# Patient Record
Sex: Female | Born: 1950 | Race: White | Hispanic: No | Marital: Married | Smoking: Former smoker
Health system: Southern US, Community
[De-identification: ages and names within clinical notes are randomized; demographics above are authoritative.]

## PROBLEM LIST (undated history)

## (undated) DIAGNOSIS — L709 Acne, unspecified: Secondary | ICD-10-CM

## (undated) DIAGNOSIS — E039 Hypothyroidism, unspecified: Secondary | ICD-10-CM

## (undated) DIAGNOSIS — D649 Anemia, unspecified: Secondary | ICD-10-CM

## (undated) DIAGNOSIS — I639 Cerebral infarction, unspecified: Secondary | ICD-10-CM

## (undated) DIAGNOSIS — M8580 Other specified disorders of bone density and structure, unspecified site: Secondary | ICD-10-CM

## (undated) HISTORY — PX: BREAST ENHANCEMENT SURGERY: SHX7

## (undated) HISTORY — DX: Acne, unspecified: L70.9

## (undated) HISTORY — DX: Hypothyroidism, unspecified: E03.9

## (undated) HISTORY — DX: Cerebral infarction, unspecified: I63.9

## (undated) HISTORY — PX: VEIN LIGATION AND STRIPPING: SHX2653

## (undated) HISTORY — DX: Anemia, unspecified: D64.9

## (undated) HISTORY — DX: Other specified disorders of bone density and structure, unspecified site: M85.80

---

## 1998-02-20 ENCOUNTER — Other Ambulatory Visit: Admission: RE | Admit: 1998-02-20 | Discharge: 1998-02-20 | Payer: Self-pay | Admitting: Gynecology

## 1998-04-23 ENCOUNTER — Other Ambulatory Visit: Admission: RE | Admit: 1998-04-23 | Discharge: 1998-04-23 | Payer: Self-pay | Admitting: Gynecology

## 1999-03-25 ENCOUNTER — Other Ambulatory Visit: Admission: RE | Admit: 1999-03-25 | Discharge: 1999-03-25 | Payer: Self-pay | Admitting: Gynecology

## 2000-04-12 ENCOUNTER — Other Ambulatory Visit: Admission: RE | Admit: 2000-04-12 | Discharge: 2000-04-12 | Payer: Self-pay | Admitting: Gynecology

## 2001-01-26 ENCOUNTER — Encounter: Payer: Self-pay | Admitting: Vascular Surgery

## 2001-01-28 ENCOUNTER — Encounter (INDEPENDENT_AMBULATORY_CARE_PROVIDER_SITE_OTHER): Payer: Self-pay | Admitting: Specialist

## 2001-01-28 ENCOUNTER — Ambulatory Visit (HOSPITAL_COMMUNITY): Admission: RE | Admit: 2001-01-28 | Discharge: 2001-01-28 | Payer: Self-pay | Admitting: Vascular Surgery

## 2001-04-05 ENCOUNTER — Other Ambulatory Visit: Admission: RE | Admit: 2001-04-05 | Discharge: 2001-04-05 | Payer: Self-pay | Admitting: Gynecology

## 2002-06-20 ENCOUNTER — Other Ambulatory Visit: Admission: RE | Admit: 2002-06-20 | Discharge: 2002-06-20 | Payer: Self-pay | Admitting: Gynecology

## 2003-01-20 DIAGNOSIS — I639 Cerebral infarction, unspecified: Secondary | ICD-10-CM

## 2003-01-20 HISTORY — PX: PATENT FORAMEN OVALE CLOSURE: SHX2181

## 2003-01-20 HISTORY — DX: Cerebral infarction, unspecified: I63.9

## 2003-12-10 ENCOUNTER — Other Ambulatory Visit: Admission: RE | Admit: 2003-12-10 | Discharge: 2003-12-10 | Payer: Self-pay | Admitting: Gynecology

## 2003-12-19 ENCOUNTER — Ambulatory Visit: Payer: Self-pay | Admitting: Gastroenterology

## 2003-12-21 ENCOUNTER — Ambulatory Visit: Payer: Self-pay | Admitting: Gastroenterology

## 2004-09-09 ENCOUNTER — Ambulatory Visit (HOSPITAL_COMMUNITY): Admission: RE | Admit: 2004-09-09 | Discharge: 2004-09-09 | Payer: Self-pay | Admitting: Plastic Surgery

## 2004-10-16 ENCOUNTER — Inpatient Hospital Stay (HOSPITAL_COMMUNITY): Admission: EM | Admit: 2004-10-16 | Discharge: 2004-10-18 | Payer: Self-pay | Admitting: Emergency Medicine

## 2004-10-17 ENCOUNTER — Ambulatory Visit: Payer: Self-pay | Admitting: Cardiology

## 2004-10-17 ENCOUNTER — Encounter: Payer: Self-pay | Admitting: Cardiology

## 2004-10-20 ENCOUNTER — Ambulatory Visit (HOSPITAL_COMMUNITY): Admission: RE | Admit: 2004-10-20 | Discharge: 2004-10-20 | Payer: Self-pay | Admitting: Cardiology

## 2004-10-20 ENCOUNTER — Encounter: Payer: Self-pay | Admitting: Cardiology

## 2004-10-23 ENCOUNTER — Ambulatory Visit (HOSPITAL_COMMUNITY): Admission: RE | Admit: 2004-10-23 | Discharge: 2004-10-23 | Payer: Self-pay | Admitting: Neurology

## 2004-12-05 ENCOUNTER — Ambulatory Visit (HOSPITAL_COMMUNITY): Admission: RE | Admit: 2004-12-05 | Discharge: 2004-12-06 | Payer: Self-pay | Admitting: Cardiology

## 2005-01-15 ENCOUNTER — Other Ambulatory Visit: Admission: RE | Admit: 2005-01-15 | Discharge: 2005-01-15 | Payer: Self-pay | Admitting: Gynecology

## 2006-01-21 ENCOUNTER — Other Ambulatory Visit: Admission: RE | Admit: 2006-01-21 | Discharge: 2006-01-21 | Payer: Self-pay | Admitting: Gynecology

## 2007-05-05 ENCOUNTER — Other Ambulatory Visit: Admission: RE | Admit: 2007-05-05 | Discharge: 2007-05-05 | Payer: Self-pay | Admitting: Gynecology

## 2010-06-06 NOTE — H&P (Signed)
[name], MANALANG              ACCOUNT NO.:  [id number]   MEDICAL RECORD NO.:  [id number]          PATIENT TYPE:  INP   LOCATION:  3041                         FACILITY:  [facility]   PHYSICIAN:  Casimiro Needle L. Reynolds, M.D.DATE OF BIRTH:  Jan 12, 1951   DATE OF ADMISSION:  10/16/2004  DATE OF DISCHARGE:                                HISTORY & PHYSICAL   CHIEF COMPLAINT:  Diplopia dysarthria, left-sided facial numbness.   HISTORY OF PRESENT ILLNESS:  This is the initial stroke service admission  for this 60 year old woman with little past medical history.  The patient  was at home eating dinner with her husband tonight, when at about 7:30 p.m.  noticed the acute onset of a dizzy sensation; which did not feel like  vertigo.  She also noted that the right side of her face felt numb and  tight.  She held her hand up in front of her face at that point and noted  diplopia when looking out front to her left and to her right.  Her husband  noted that she had some slurred speech at that time, and the patient felt  like she was having difficulty getting words out.  The patient's husband put  her in the car and drove her to the emergency room.  He said that en route  she seemed a little bit lethargic.   Since she has arrived, most of her symptoms have gotten much better;  although the left-sided facial symptoms persist.  She denies any history of  previous similar symptoms. She had no associated headache, chest pain,  palpitations; or any numbness, tingling or weakness of the limbs.   PAST MEDICAL HISTORY:  1.  Hypothyroidism, for which she is on medications.  2.  Recent face lift in July 2006, and since that time has had some      persistent asymmetry of the face, especially of eye closure.  Otherwise, she denies chronic medical problems.  She has no history of  surgeries.   FAMILY HISTORY:  Relevant for stroke in her mother, who had her problems in  69s.   SOCIAL HISTORY:  She does not  smoke.  She exercises regularly.  She lives  with her husband and is normally independent in activities of daily living.   ALLERGIES:  NO KNOWN DRUG ALLERGIES.   MEDICATIONS:  1.  Levothyroxine.  2.  Hormone replacement therapy.   REVIEW OF SYSTEMS:  Review of systems is negative, except as outlined in the  HPI in the emergency room and admission nursing records.   PHYSICAL EXAMINATION:  VITAL SIGNS:  Temperature 97.6, blood pressure  122/81, pulse 69, respirations 20.  GENERAL:  This is a healthy-appearing woman, lying supine in the hospital  bed and no evident distress.  HEENT:  Head normocephalic and atraumatic.  Oropharynx benign.  NECK:  Supple without carotid bruits.  HEART:  Regular rate and rhythm without murmur.  CHEST:  Clear to auscultation bilaterally.  NEUROLOGIC:  Mental status:  She is awake, alert and fully oriented.  Recent  and remote memory are intact.  Attention span, concentration and  fund of  knowledge are all appropriate. She has no defects to competitional naming  and can repeat a phrase.  Mood is euthymic and affect appropriate.   Cranial nerves: Pupils equal and reactive.  Extraocular movements are full  without nystagmus.  Visual fields are full to confrontation.  Hearing is  intact to finger snap.  Facial sensation is intact to pinprick.  She seems  to have a little bit of a droop on the left side of her face, but it is not  clear if this acute.  The patient's tongue and palate move normally and  symmetrically.  Shoulder shrug and strength is normal.   Motor Testing:  Normal bulk and tone.  Normal strength in all tested  extremity muscles.   Sensation: intact to pinprick and light touch in all extremities.   Coordination:  Cerebellar finger-to-nose and heel-to-shin are performed  well.   Gait:  She arises easily from the bed.  She is able to walk around in the ER  a little bit without difficulty.  She can actually tandem walk a little bit  without  difficulty.   LABORATORY REVIEW:  CBC:  White count 7.3, hemoglobin 13.7, platelets  385,000.  CMET is normal.  Prothrombin Time 13.1, PTT slightly elevated at  39.  CT of the head is personally reviewed and studied; to my eye looks  normal.   IMPRESSION:  Suspected cerebrovascular event involving the brainstem.  Etiology is uncertain and she is relatively young; has no known chronic risk  factors.   PLAN:  Will treat with aspirin a day.  Will admit for routine stroke workup,  including:  MRI, MRA, carotid transcranial Doppler, 2-D echocardiogram,  telemetry monitoring and stroke labs.  Stroke Service is to follow.      Michael L. Thad Ranger, M.D.  Electronically Signed     MLR/MEDQ  D:  10/16/2004  T:  10/17/2004  Job:  [id number]

## 2010-06-06 NOTE — Consult Note (Signed)
[name], [name]              ACCOUNT NO.:  [id number]   MEDICAL RECORD NO.:  [id number]          PATIENT TYPE:  OIB   LOCATION:  6523                         FACILITY:  [facility]   PHYSICIAN:  Cristy Hilts. Jacinto Halim, MD       DATE OF BIRTH:  Aug 09, 1950   DATE OF CONSULTATION:  DATE OF DISCHARGE:  12/06/2004                                   CONSULTATION   ADMISSION DIAGNOSES:  1.  Embolic stroke.  2.  Patent foramen ovale.   DISCHARGE DIAGNOSES:  1.  Embolic stroke.  2.  Patent foramen ovale.   PROCEDURE:  Intracardiac echo, double contrast study, patent foramen ovale  closure with 33-mm carotid seal septal occluder and post contrast study to  rule out right to left shunt.   BRIEF HISTORY:  The patient is a 60 year old female with no significant  prior cardiac history except for hypothyroidism. She has been on thyroid  replacement for some time. She was recently admitted to [facility] on  October 16, 2004, with sudden onset of slurred speech, double vision,  facial numbness. She was eventually found to have a left thalamic infarct.  Her hypercoagulability workup was negative. She underwent a 2-D echo and a  TEE evaluation which revealed a large PFO with a strong positive bubble  study for right to left shunting. Given no other significant cardiovascular  risk for her stroke in the present of PFO. She was evaluated by Dr. Jacinto Halim  for PFO closure. After a long discussion it was his opinion that they go  forward with closure of the PFO and she was admitted for the same at this  time.   PAST MEDICAL HISTORY:  As above.   MEDICATIONS ON ADMISSION:  1.  Synthroid 75 mcg daily.  2.  Baby aspirin 81 mg p.o. daily.   ALLERGIES:  One known.   FAMILY HISTORY:  No history of premature coronary artery disease. No history  of premature CV disease.   PAST SURGICAL HISTORY:  Facelift and breast augmentation.   SOCIAL HISTORY:  She does not smoke and does not use alcohol. She exercises  on a regular basis. For further history and physical please see Dr. Verl Dicker  note.   HOSPITAL COURSE:  The patient was admitted and underwent the above noted  procedure. She tolerated the procedure well. The following a.m. the cath  site looked good. She was seen by Dr. Avie Echevaria post procedure and it was  his opinion she was doing well. He following a.m. the patient was seen by  Dr. Jenne Campus. Labs showed a hemoglobin of 12.8, hematocrit 36.6, white count  7.9, platelet count 267,000. Electrolytes were normal. BUN was 11,  creatinine 0.9, and glucose was 94. At that point the patient was felt to be  ready for discharge. She was discharged home on her Synthroid 75 mcg daily,  aspirin 325 mg daily, Toprol XL 25 mg daily, and Plavix 75 mg one daily. She  was given prescriptions for the Toprol and Plavix. Also instructed on her  instruction sheet to have antibiotic prophylaxis for dental work or surgical  procedures over the next six months. Will follow up in Dr. Verl Dicker office in  two to three weeks.      Eber Hong, P.A.      Cristy Hilts. Jacinto Halim, MD  Electronically Signed    WDJ/MEDQ  D:  12/06/2004  T:  12/07/2004  Job:  [id number]   cc:   Genene Churn. Love, M.D.  Fax: [phone]

## 2010-06-06 NOTE — Discharge Summary (Signed)
[name], [name]              ACCOUNT NO.:  [id number]   MEDICAL RECORD NO.:  [id number]          PATIENT TYPE:  INP   LOCATION:  3041                         FACILITY:  [facility]   PHYSICIAN:  Melvyn Novas, M.D.  DATE OF BIRTH:  [birth year]   DATE OF ADMISSION:  10/16/2004  DATE OF DISCHARGE:  10/18/2004                           DISCHARGE SUMMARY - REFERRING   HISTORY OF PRESENT ILLNESS:  [name] is a patient who presented  with left sided facial numbness and diplopia as well as slurring of speech.  The admission initially warranted a CT scan of the head, which showed no  abnormality in the emergency room. The patient symptoms resolved by  themselves without any treatment to a great deal and she had no history of  previous similar symptoms. She has also not complained of associated  headache, chest pain, palpitations, tingling, weakness, or numbness.   PAST MEDICAL HISTORY:  Only positive for hypothyroidism. She recently had a  face lift in July of 2006 and breast augmentation. Otherwise, she has no  chronic medical problems. Has no history of other surgeries besides the two  cosmetic surgeries.   FAMILY HISTORY:  Relevant for a stroke in her mother.   MEDICATIONS:  She was on Levothyroxine and hormone replacement therapy.   PHYSICAL EXAMINATION:  GENERAL:  Examination showed no focal abnormality.   LABORATORY DATA:  CBC with diff was normal. Complex metabolic panel was  normal. The patient had a creatinine level of 0.9, PTT of 39. Hemoglobin and  hematocrit 13 and 38. Blood serum glucose of 83. Homocystine was negative.  Lipid panel was normal. Oxygen saturation on room air was normal.   The patient had a 2-D echocardiogram, which failed to document any cardiac  source of embolism or abnormality on October 17, 2004.   The patient had a cerebrovascular evaluation by Doppler study, which showed  no significant internal carotid artery stenosis on either side and  normal  vertebral artery flow. There is no evidence of any plaque noted on her MRA  or her Doppler studies. MRI documented, however, a small approximately 1 cm  sized left deep thalamic ischemic lesion.   ASSESSMENT:  1.  Lacunar stroke.  2.  Risk factors possible hormone replacement therapy but there is truly no      evidence of any other physiologic abnormality in this patient.   PLAN:  We will ask her to start a baby aspirin a day, continue her thyroid  medicine, and discontinue the hormone replacement therapy.   The patient asked for a copy of her discharge summary to be forwarded to her  primary care physician, Dr. Morton Stall in [place], [place] [facility].           ______________________________  Melvyn Novas, M.D.     CD/MEDQ  D:  10/18/2004  T:  10/18/2004  Job:  [id number]   cc:   Morton Stall, M.D.  [facility]

## 2010-06-06 NOTE — Op Note (Signed)
[place]  Patient:    [name], NUSSBAUMER Visit Number: [id number] MRN: [id number]          Service Type: Attending:  Larina Earthly, M.D. Dictated by:   Larina Earthly, M.D. Proc. Date: 01/28/01   CC:         Dr. Morton Stall, 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  PROCEDURES: 1. Ligation and stripping of greater saphenous vein from groin to mid thigh. 2. Tributary varicosity removal with Trivex System.  SURGEON:  Larina Earthly, M.D.  ASSISTANT:  Nurse.  ANESTHESIA:  General endotracheal.  COMPLICATIONS:  None.  DISPOSITION:  To recovery room, stable.  PROCEDURE IN DETAIL:  The patient was taken to the operating room after the varicosities in her left leg were marked.  She had an incision over her saphenofemoral junction and carried down to isolate the saphenofemoral junction, which was doubly ligated with 2-0 silk ties.  A stripper was passed retrograde down the saphenofemoral junction to the level of the distal thigh. Valves became competent at this level and this was also seen on Duplex preoperatively,  A separate incision was made at this level and the stripper and the saphenous vein was ligated distal to this and the stripper head was brought out through this incision.  Next, using a Trivex System and tumescent solution with epinephrine and lidocaine, the large tributary varicosity extending from the level of her groin across her anterior thigh to her lateral knee and down into her calf was removed through multiple separate small stab incisions.  The transilluminated veins were removed with the cutter system. The tumescent solution was reused to irrigate these subcutaneous tunnels with irrigant.  The patient also had removal of tributary  varicosities in the medial and posterior calf.  After all tributary varicosities were removed, the wounds again were flushed with tumescent solution and there was tumescent solution instilled along the tract of the saphenous vein.  The saphenous vein was then removed from the distal thigh to the groin with the stripper. Hemostasis was obtained with pressure.  The groin incision and the distal thigh incision were closed with a 3-0 Vicryl in the subcutaneous and subcuticular tissue.  Benzoin and Steri-Strips were placed over all stab incisions from the Trivex removal of the tributary varicosities.  Kerlix and Coban were used for a compression dressing.  The patient was transferred to the recovery room in stable condition. Dictated by:   Larina Earthly, M.D. Attending:  Larina Earthly, M.D. DD:  01/28/01 TD:  01/28/01 Job: [id number]

## 2010-06-06 NOTE — Op Note (Signed)
[name], [name]              ACCOUNT NO.:  [id number]   MEDICAL RECORD NO.:  [id number]          PATIENT TYPE:  OIB   LOCATION:  6523                         FACILITY:  [facility]   PHYSICIAN:  Cristy Hilts. Jacinto Halim, MD       DATE OF BIRTH:  1950/11/05   DATE OF PROCEDURE:  12/05/2004  DATE OF DISCHARGE:  12/06/2004                                 OPERATIVE REPORT   REFERRING PHYSICIANS:  1.  Harl Bowie, MD  2.  Delia Heady, MD   PROCEDURE PERFORMED:  1.  Intracardiac echocardiogram.  2.  Double-contrast injection to evaluate the patent foramen ovale.  3.  Closure of the patent foramen ovale with a 33-mm CardioSEAL septal      occluder.  4.  Post-procedure double-contrast study.   ATTENDING PHYSICIAN:  Cristy Hilts. Jacinto Halim, MD   INDICATION:  [name] is a 60 year old female with no significant  cardiovascular risk factors, who only has history of hypothyroidism and is  on Synthroid.  She has had a thalamic stroke.  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  There was spontaneous shunting from  right-to-left by TEE.  The stroke occurred in September of 2006.  Given  this, it was felt that the PFO was probably the most probable culprit.  After explaining the risks, benefits, alternatives and data regarding PFO  closure, the patient consented to have PFO closure.  She was brought to the  catheterization lab for reevaluate of her PFO and for possible PFO closure.   INTRACARDIAC ECHOCARDIOGRAPHIC DATA:  Intracardiac echocardiogram revealed  normal tricuspid valve with minimal tricuspid regurgitation and normal right  ventricle.  The mitral valve was normal with minimal mitral valve  regurgitation with normal left ventricular systolic function.  The aortic  valve was normal; there was no evidence of aortic regurgitation.  Right  atrium and left atrium appeared to be normal.  The interatrial septum was  aneurysmal with a large PFO measuring 0.9 cm x ICE and  with a long tunnel.  Otherwise, no structural abnormalities were noted.   ASSESSMENT:  Successful patent foramen ovale closure with a 33-mm CardioSEAL  septal occluder.  Post closure, agitated saline injection revealed no  evidence of right-to-left shunting.   RECOMMENDATIONS:  1.  The patient underwent successful closure of the PFO with a 33-mm      CardioSEAL septal occluder.  She will be continued on aspirin and Plavix      for a period of 3 months and then aspirin indefinitely.  2.  She needs antibiotic prophylaxis for a period of 6 months.  3.  I have started her on prophylactic Toprol-XL for probable SVT and this      will be done for a period of 6 weeks only, and then will be stopped.  4.  If the patient remains stable, she will be discharged home in the      morning.   TECHNIQUE OF PROCEDURE:  Under the usual sterile precautions, using an 25-  French right femoral venous access and a 9-French left femoral  venous  access, an intracardiac echo probe was introduced through the left femoral  venous access.  The cardiac structures were carefully analyzed and  visualized.   Using a 6-French multipurpose A2 catheter, the PFO was easily crossed.  Using a 0.025-inch Amplatzer wire with a soft tip, the Amplatzer wire was  carefully positioned in the left upper pulmonary vein.  Then using a 25-mm  PFO sizing balloon, the size of the PFO and also the length of the tunnel  were carefully measured.  It was decided that probably a 33-mm occluder  device was most appropriate.  Then after withdrawing the sizing balloon, the  11-French sheath was exchanged to a 11-French Mullins sheath.  The sheath  was carefully positioned across the interatrial septum into the atrium.  Then the septal occluder was carefully advanced through the [facility] sheath  and the left atrial side was carefully deployed.  After confirming the  device was positioned through the intracardiac echo, the right atrial side   was then deployed.  Excellent apposition was noted.  Then the device was  released.  A double-contrast injection was again performed post procedure.  Excellent results were noted.  There were no immediate complications noted.  The patient tolerated the procedure well.  During the procedure, intravenous  heparin was administered and the ACT was maintained at greater than 250.      Cristy Hilts. Jacinto Halim, MD  Electronically Signed     JRG/MEDQ  D:  12/05/2004  T:  12/06/2004  Job:  [id number]   cc:   Pramod P. Pearlean Brownie, MD  Fax: [phone]

## 2011-10-06 ENCOUNTER — Other Ambulatory Visit: Payer: Self-pay | Admitting: Gynecology

## 2011-10-06 DIAGNOSIS — R928 Other abnormal and inconclusive findings on diagnostic imaging of breast: Secondary | ICD-10-CM

## 2011-10-13 ENCOUNTER — Ambulatory Visit
Admission: RE | Admit: 2011-10-13 | Discharge: 2011-10-13 | Disposition: A | Discharge status: Home / Self Care | Payer: 59 | Source: Ambulatory Visit | Attending: Gynecology | Admitting: Gynecology

## 2011-10-13 DIAGNOSIS — R928 Other abnormal and inconclusive findings on diagnostic imaging of breast: Secondary | ICD-10-CM

## 2011-12-01 ENCOUNTER — Encounter: Payer: Self-pay | Admitting: Sports Medicine

## 2011-12-01 ENCOUNTER — Ambulatory Visit (INDEPENDENT_AMBULATORY_CARE_PROVIDER_SITE_OTHER): Payer: 59 | Admitting: Sports Medicine

## 2011-12-01 VITALS — BP 125/79 | HR 60 | Ht 60.0 in | Wt 105.5 lb

## 2011-12-01 DIAGNOSIS — M4120 Other idiopathic scoliosis, site unspecified: Secondary | ICD-10-CM

## 2011-12-01 DIAGNOSIS — M419 Scoliosis, unspecified: Secondary | ICD-10-CM | POA: Insufficient documentation

## 2011-12-01 DIAGNOSIS — M545 Low back pain: Secondary | ICD-10-CM | POA: Insufficient documentation

## 2011-12-01 NOTE — Progress Notes (Signed)
Patient ID: [name], female   DOB: 1950/03/11, 61 y.o.   MRN: [id number] Subjective:   61 yo new patient presents for evaluation of low back pain x 2 years. Pain is located at the midline and toward the L side. She describes the pain as stiffness and achiness. It is non-radiating. Symptoms occur daily each morning and after long periods of standing. She denies associated fever, chills, weight loss, tingling/numbness. She did have an 8 mos course of frequent diarrhea associated with increased stress but this was not associated with the onset of her low back pain and has resolved. She does have a history of osteopenia diagnosed on a bone density scan, does not recall T score.   Medical History:  Hypothyroidism on synthroid Stroke in 2005 secondary to PFO Osteopenia  Hormone replacement therapy Activella   Negative history of inflammatory bowel disease or pelvic inflammatory disease.   Surgical History PFO repair  Family History  Hrt disease- mother, deceased age 23 HTN-mother DM-neg   Social History  Previous smoker quit 2 years ago.  2 drinks daily on the weekend only Works as an Airline pilot for [employer]   Objective:  BP 125/79  Pulse 60  Ht 5' (1.524 m)  Wt 105 lb 8 oz (47.854 kg)  BMI 20.60 kg/m2 General appearance: alert, cooperative, no distress and thin, appears younger than stated age.  MSK: Back: L shoulder higher than right.  Lumbar scoliosis with curve and rotation rightward.  Elevation of RT low back with forward bend Normal leg lengths.  SI joint with decreased movement on L.  No spinous process tenderness. Left L4 paraspinal muscle soreness.  Negative straight leg raising test. Negative FABER.  Able to heel and toe walk without difficulty.  2+ patellar and Achilles reflex on R. 1+ patellar and Achilles reflex on L.   Gait:  Negative trendelenburg. R hip rotates anteriorly.  Neutral gait

## 2011-12-01 NOTE — Assessment & Plan Note (Addendum)
A: low back pain without radiation. Lumbar scoliosis with rotation noted. Pain concerning for degenerativ disc disease. P:  AP and lateral lumbar and sacral spine films. Will call with results.  Avoid exercises that load the disc (weight bearing squats and burpies).  Perform exercises to stretch the low back including knee to chest, knee to shoulder, b/l knee to chest with rocking, sit ups and superman.  F/u in 4-6 weeks.

## 2011-12-01 NOTE — Assessment & Plan Note (Signed)
A: R lumbar scoliosis, ? Acquired. P:  Evaluate with plain films.

## 2011-12-07 ENCOUNTER — Ambulatory Visit
Admission: RE | Admit: 2011-12-07 | Discharge: 2011-12-07 | Disposition: A | Discharge status: Home / Self Care | Payer: 59 | Source: Ambulatory Visit | Attending: Sports Medicine | Admitting: Sports Medicine

## 2011-12-07 DIAGNOSIS — M419 Scoliosis, unspecified: Secondary | ICD-10-CM

## 2011-12-07 DIAGNOSIS — M545 Low back pain: Secondary | ICD-10-CM

## 2011-12-07 NOTE — Addendum Note (Signed)
Addended by: Garen Grams F on: 12/07/2011 03:33 PM   Modules accepted: Orders

## 2012-01-05 ENCOUNTER — Ambulatory Visit: Payer: 59 | Admitting: Sports Medicine

## 2012-01-06 ENCOUNTER — Other Ambulatory Visit: Payer: Self-pay | Admitting: Neurological Surgery

## 2012-01-06 DIAGNOSIS — M4310 Spondylolisthesis, site unspecified: Secondary | ICD-10-CM

## 2012-01-08 ENCOUNTER — Other Ambulatory Visit: Payer: 59

## 2014-01-10 ENCOUNTER — Other Ambulatory Visit: Payer: Self-pay | Admitting: Gynecology

## 2014-01-16 LAB — CYTOLOGY - PAP

## 2014-05-05 ENCOUNTER — Emergency Department (HOSPITAL_COMMUNITY): Payer: Managed Care, Other (non HMO)

## 2014-05-05 ENCOUNTER — Encounter (HOSPITAL_COMMUNITY): Payer: Self-pay

## 2014-05-05 ENCOUNTER — Emergency Department (HOSPITAL_COMMUNITY)
Admission: EM | Admit: 2014-05-05 | Discharge: 2014-05-05 | Disposition: A | Discharge status: Home / Self Care | Payer: Managed Care, Other (non HMO) | Attending: Emergency Medicine | Admitting: Emergency Medicine

## 2014-05-05 DIAGNOSIS — Y9389 Activity, other specified: Secondary | ICD-10-CM | POA: Insufficient documentation

## 2014-05-05 DIAGNOSIS — Y9289 Other specified places as the place of occurrence of the external cause: Secondary | ICD-10-CM | POA: Diagnosis not present

## 2014-05-05 DIAGNOSIS — Z79899 Other long term (current) drug therapy: Secondary | ICD-10-CM | POA: Diagnosis not present

## 2014-05-05 DIAGNOSIS — Z87891 Personal history of nicotine dependence: Secondary | ICD-10-CM | POA: Insufficient documentation

## 2014-05-05 DIAGNOSIS — S52531A Colles' fracture of right radius, initial encounter for closed fracture: Secondary | ICD-10-CM | POA: Insufficient documentation

## 2014-05-05 DIAGNOSIS — Y998 Other external cause status: Secondary | ICD-10-CM | POA: Insufficient documentation

## 2014-05-05 DIAGNOSIS — S52501A Unspecified fracture of the lower end of right radius, initial encounter for closed fracture: Secondary | ICD-10-CM

## 2014-05-05 DIAGNOSIS — W1839XA Other fall on same level, initial encounter: Secondary | ICD-10-CM | POA: Insufficient documentation

## 2014-05-05 DIAGNOSIS — Z8673 Personal history of transient ischemic attack (TIA), and cerebral infarction without residual deficits: Secondary | ICD-10-CM | POA: Diagnosis not present

## 2014-05-05 DIAGNOSIS — Z8639 Personal history of other endocrine, nutritional and metabolic disease: Secondary | ICD-10-CM | POA: Diagnosis not present

## 2014-05-05 DIAGNOSIS — S6991XA Unspecified injury of right wrist, hand and finger(s), initial encounter: Secondary | ICD-10-CM | POA: Diagnosis present

## 2014-05-05 DIAGNOSIS — W19XXXA Unspecified fall, initial encounter: Secondary | ICD-10-CM

## 2014-05-05 MED ORDER — HYDROMORPHONE HCL 1 MG/ML IJ SOLN
0.5000 mg | Freq: Once | INTRAMUSCULAR | Status: AC
  Administered 2014-05-05: 0.5 mg via INTRAVENOUS
  Filled 2014-05-05: qty 1

## 2014-05-05 MED ORDER — SODIUM CHLORIDE 0.9 % IV BOLUS (SEPSIS)
500.0000 mL | Freq: Once | INTRAVENOUS | Status: AC
  Administered 2014-05-05: 500 mL via INTRAVENOUS

## 2014-05-05 MED ORDER — ONDANSETRON HCL 4 MG/2ML IJ SOLN
4.0000 mg | Freq: Once | INTRAMUSCULAR | Status: AC
  Administered 2014-05-05: 4 mg via INTRAVENOUS
  Filled 2014-05-05: qty 2

## 2014-05-05 MED ORDER — OXYCODONE-ACETAMINOPHEN 5-325 MG PO TABS: 1.0000 | ORAL_TABLET | Freq: Four times a day (QID) | ORAL | Status: DC | PRN

## 2014-05-05 NOTE — Progress Notes (Signed)
Orthopedic Tech Progress Note Patient Details:  [name] 1950/01/25 [id number]  Ortho Devices Type of Ortho Device: Ace wrap, Arm sling, Sugartong splint Ortho Device/Splint Location: RUE Ortho Device/Splint Interventions: Ordered, Application   Braulio Bosch 05/05/2014, 6:53 PM

## 2014-05-05 NOTE — ED Notes (Signed)
Pt also c/o left lower back pain.

## 2014-05-05 NOTE — ED Notes (Signed)
Onset 30 minutes ago pt fell backwards on right hand. Knot noted to wrist area.  Pt nauseous.

## 2014-05-05 NOTE — ED Notes (Signed)
Ortho notified

## 2014-05-05 NOTE — Discharge Instructions (Signed)
Present to the Emergency Department at 6 am Sunday morning (tomorrow) for repair of your wrist.  Do not have anything to eat or drink after midnight.     Cast or Splint Care Casts and splints support injured limbs and keep bones from moving while they heal. It is important to care for your cast or splint at home.  HOME CARE INSTRUCTIONS  Keep the cast or splint uncovered during the drying period. It can take 24 to 48 hours to dry if it is made of plaster. A fiberglass cast will dry in less than 1 hour.  Do not rest the cast on anything harder than a pillow for the first 24 hours.  Do not put weight on your injured limb or apply pressure to the cast until your health care provider gives you permission.  Keep the cast or splint dry. Wet casts or splints can lose their shape and may not support the limb as well. A wet cast that has lost its shape can also create harmful pressure on your skin when it dries. Also, wet skin can become infected.  Cover the cast or splint with a plastic bag when bathing or when out in the rain or snow. If the cast is on the trunk of the body, take sponge baths until the cast is removed.  If your cast does become wet, dry it with a towel or a blow dryer on the cool setting only.  Keep your cast or splint clean. Soiled casts may be wiped with a moistened cloth.  Do not place any hard or soft foreign objects under your cast or splint, such as cotton, toilet paper, lotion, or powder.  Do not try to scratch the skin under the cast with any object. The object could get stuck inside the cast. Also, scratching could lead to an infection. If itching is a problem, use a blow dryer on a cool setting to relieve discomfort.  Do not trim or cut your cast or remove padding from inside of it.  Exercise all joints next to the injury that are not immobilized by the cast or splint. For example, if you have a long leg cast, exercise the hip joint and toes. If you have an arm cast or  splint, exercise the shoulder, elbow, thumb, and fingers.  Elevate your injured arm or leg on 1 or 2 pillows for the first 1 to 3 days to decrease swelling and pain.It is best if you can comfortably elevate your cast so it is higher than your heart. SEEK MEDICAL CARE IF:   Your cast or splint cracks.  Your cast or splint is too tight or too loose.  You have unbearable itching inside the cast.  Your cast becomes wet or develops a soft spot or area.  You have a bad smell coming from inside your cast.  You get an object stuck under your cast.  Your skin around the cast becomes red or raw.  You have new pain or worsening pain after the cast has been applied. SEEK IMMEDIATE MEDICAL CARE IF:   You have fluid leaking through the cast.  You are unable to move your fingers or toes.  You have discolored (blue or white), cool, painful, or very swollen fingers or toes beyond the cast.  You have tingling or numbness around the injured area.  You have severe pain or pressure under the cast.  You have any difficulty with your breathing or have shortness of breath.  You have chest  pain. Document Released: 01/03/2000 Document Revised: 10/26/2012 Document Reviewed: 07/14/2012 [facility] Patient Information 2015 [place], [place]. This information is not intended to replace advice given to you by your health care provider. Make sure you discuss any questions you have with your health care provider.   Wrist Fracture A wrist fracture is a break or crack in one of the bones of your wrist. Your wrist is made up of eight small bones at the palm of your hand (carpal bones) and two long bones that make up your forearm (radius and ulna).  CAUSES   A direct blow to the wrist.  Falling on an outstretched hand.  Trauma, such as a car accident or a fall. RISK FACTORS Risk factors for wrist fracture include:   Participating in contact and high-risk sports, such as skiing, biking, and ice  skating.  Taking steroid medicines.  Smoking.  Being female.  Being Caucasian.  Drinking more than three alcoholic beverages per day.  Having low or lowered bone density (osteoporosis or osteopenia).  Age. Older adults have decreased bone density.  Women who have had menopause.  History of previous fractures. SIGNS AND SYMPTOMS Symptoms of wrist fractures include tenderness, bruising, and inflammation. Additionally, the wrist may hang in an odd position or appear deformed.  DIAGNOSIS Diagnosis may include:  Physical exam.  X-ray. TREATMENT Treatment depends on many factors, including the nature and location of the fracture, your age, and your activity level. Treatment for wrist fracture can be nonsurgical or surgical.  Nonsurgical Treatment A plaster cast or splint may be applied to your wrist if the bone is in a good position. If the fracture is not in good position, it may be necessary for your health care provider to realign it before applying a splint or cast. Usually, a cast or splint will be worn for several weeks.  Surgical Treatment Sometimes the position of the bone is so far out of place that surgery is required to apply a device to hold it together as it heals. Depending on the fracture, there are a number of options for holding the bone in place while it heals, such as a cast and metal pins.  HOME CARE INSTRUCTIONS  Keep your injured wrist elevated and move your fingers as much as possible.  Do not put pressure on any part of your cast or splint. It may break.   Use a plastic bag to protect your cast or splint from water while bathing or showering. Do not lower your cast or splint into water.  Take medicines only as directed by your health care provider.  Keep your cast or splint clean and dry. If it becomes wet, damaged, or suddenly feels too tight, contact your health care provider right away.  Do not use any tobacco products including cigarettes, chewing  tobacco, or electronic cigarettes. Tobacco can delay bone healing. If you need help quitting, ask your health care provider.  Keep all follow-up visits as directed by your health care provider. This is important.  Ask your health care provider if you should take supplements of calcium and vitamins C and D to promote bone healing. SEEK MEDICAL CARE IF:   Your cast or splint is damaged, breaks, or gets wet.  You have a fever.  You have chills.  You have continued severe pain or more swelling than you did before the cast was put on. SEEK IMMEDIATE MEDICAL CARE IF:   Your hand or fingernails on the injured arm turn blue or gray, or  feel cold or numb.  You have decreased feeling in the fingers of your injured arm. MAKE SURE YOU:  Understand these instructions.  Will watch your condition.  Will get help right away if you are not doing well or get worse. Document Released: 10/15/2004 Document Revised: 05/22/2013 Document Reviewed: 01/23/2011 [facility] Patient Information 2015 [place], [place]. This information is not intended to replace advice given to you by your health care provider. Make sure you discuss any questions you have with your health care provider.

## 2014-05-05 NOTE — ED Notes (Signed)
Dr Reese at the bedside.

## 2014-05-05 NOTE — ED Provider Notes (Signed)
CSN: [id number]     Arrival date & time 05/05/14  1615 History   First MD Initiated Contact with Patient 05/05/14 1632     Chief Complaint  Patient presents with  . Hand Injury     Patient is a 64 y.o. female presenting with hand injury. The history is provided by the patient. No language interpreter was used.  Hand Injury  [name] presents for evaluation of right wrist pain following a fall. She had a mechanical fall when she was moving a mattress today. She fell backwards with her outstretched right hand. She felt a snap. She does have some associated low back pain, but has a history of low back pain. She denies any head injury or loss of consciousness. She is right-handed and works as an Optometrist. She has significant pain in her right wrist. She has no history of broken bones. She has been evaluated for shoulder problems in the past.  Past Medical History  Diagnosis Date  . Hypothyroidism     on synthroid   . Stroke, embolic 9937    from PFO   Past Surgical History  Procedure Laterality Date  . Patent foramen ovale closure  2005   Family History  Problem Relation Age of Onset  . Heart disease Mother   . Hypertension Mother   . Diabetes Neg Hx    History  Substance Use Topics  . Smoking status: Former Smoker    Types: Cigarettes    Quit date: 11/30/2009  . Smokeless tobacco: Never Used  . Alcohol Use: 2.0 oz/week    4 drink(s) per week     Comment: 2 drinks nightly on weekend    OB History    No data available     Review of Systems  All other systems reviewed and are negative.     Allergies  Review of patient's allergies indicates no known allergies.  Home Medications   Prior to Admission medications   Medication Sig Start Date End Date Taking? Authorizing Provider  estradiol-norethindrone (ACTIVELLA) 1-0.5 MG per tablet Take 1 tablet by mouth daily.    Historical Provider, MD  SYNTHROID 75 MCG tablet  10/26/11   Historical Provider, MD   BP 95/51 mmHg   Pulse 71  Temp(Src) 97.3 F (36.3 C) (Oral)  Resp 18  Ht 5' (1.524 m)  Wt 103 lb (46.72 kg)  BMI 20.12 kg/m2  SpO2 100% Physical Exam  Constitutional: She is oriented to person, place, and time. She appears well-developed and well-nourished.  Uncomfortable appearing  HENT:  Head: Normocephalic and atraumatic.  Cardiovascular: Normal rate and regular rhythm.   No murmur heard. Pulmonary/Chest: Effort normal and breath sounds normal. No respiratory distress.  Abdominal: Soft. There is no tenderness. There is no rebound and no guarding.  Musculoskeletal:  Swelling and deformity to the right wrist with local tenderness to palpation. 2+ radial pulses. Moves all digits distally. No abrasions. No elbow or shoulder tenderness. No C, T, L-spine tenderness.  Neurological: She is alert and oriented to person, place, and time.  Sensation to light touch intact throughout all digits.  Skin: Skin is warm and dry.  Psychiatric: She has a normal mood and affect. Her behavior is normal.  Nursing note and vitals reviewed.   ED Course  Procedures (including critical care time) SPLINT APPLICATION Date/Time: 1:69 PM Authorized by: Quintella Reichert Consent: Verbal consent obtained. Risks and benefits: risks, benefits and alternatives were discussed Consent given by: patient Splint applied by: orthopedic technician Location  details: RUE Splint type: sugar tong Post-procedure: The splinted body part was neurovascularly unchanged following the procedure. Patient tolerance: Patient tolerated the procedure well with no immediate complications.    Labs Review Labs Reviewed - No data to display  Imaging Review Dg Lumbar Spine Complete  05/05/2014   CLINICAL DATA:  [place] [facility] off bed today.  Injured right wrist and back.  EXAM: RIGHT WRIST - COMPLETE 3+ VIEW; LUMBAR SPINE - COMPLETE 4+ VIEW  COMPARISON:  Lumbar spine MRI 01/11/2012  FINDINGS: Right wrist:  There is a comminuted dorsally impacted  intra-articular fracture of the distal radius. No definite ulnar styloid fracture. The carpal bones are intact.  Lumbar spine:  Mild right convex lumbar scoliosis and moderate degenerative lumbar spondylosis with advanced disc disease and facet disease at L4-5. No acute fracture is identified.  IMPRESSION: Dorsally impacted distal radius fracture (collies fracture).  Advanced degenerative disc disease at L4-5 but no acute findings in the lumbar spine.   Electronically Signed   By: Marijo Sanes M.D.   On: 05/05/2014 18:08   Dg Wrist Complete Right  05/05/2014   CLINICAL DATA:  [place] off bed today.  Injured right wrist and back.  EXAM: RIGHT WRIST - COMPLETE 3+ VIEW; LUMBAR SPINE - COMPLETE 4+ VIEW  COMPARISON:  Lumbar spine MRI 01/11/2012  FINDINGS: Right wrist:  There is a comminuted dorsally impacted intra-articular fracture of the distal radius. No definite ulnar styloid fracture. The carpal bones are intact.  Lumbar spine:  Mild right convex lumbar scoliosis and moderate degenerative lumbar spondylosis with advanced disc disease and facet disease at L4-5. No acute fracture is identified.  IMPRESSION: Dorsally impacted distal radius fracture (collies fracture).  Advanced degenerative disc disease at L4-5 but no acute findings in the lumbar spine.   Electronically Signed   By: Marijo Sanes M.D.   On: 05/05/2014 18:08     EKG Interpretation   Date/Time:  Saturday May 05 2014 19:07:20 EDT Ventricular Rate:  63 PR Interval:  144 QRS Duration: 64 QT Interval:  434 QTC Calculation: 444 R Axis:   88 Text Interpretation:   Sinus rhythm Atrial premature complexes Borderline  right axis deviation Lead(s) I were not used for morphology analysis  Confirmed by Hazle Coca [id number]) on 05/05/2014 7:12:17 PM      MDM   Final diagnoses:  Fall, initial encounter  Distal radius fracture, right, closed, initial encounter    Patient here for evaluation for injuries following a mechanical fall. Has a distal  radius fracture. Discussed with Dr. Caralyn Guile with [facility]. The patient was placed in a sugar tong splint with plans to return tomorrow for outpatient surgical repair. There is no evidence of additional injuries. Discussed with patient home care.   Quintella Reichert, MD 05/05/14 [phone]

## 2014-05-06 ENCOUNTER — Ambulatory Visit (HOSPITAL_COMMUNITY): Payer: Managed Care, Other (non HMO) | Admitting: Anesthesiology

## 2014-05-06 ENCOUNTER — Ambulatory Visit (HOSPITAL_COMMUNITY)
Admission: RE | Admit: 2014-05-06 | Discharge: 2014-05-06 | Disposition: A | Discharge status: Home / Self Care | Payer: Managed Care, Other (non HMO) | Source: Ambulatory Visit | Attending: Orthopedic Surgery | Admitting: Orthopedic Surgery

## 2014-05-06 ENCOUNTER — Encounter (HOSPITAL_COMMUNITY): Payer: Self-pay | Admitting: Anesthesiology

## 2014-05-06 ENCOUNTER — Encounter (HOSPITAL_COMMUNITY)
Admission: RE | Disposition: A | Discharge status: Home / Self Care | Payer: Self-pay | Source: Ambulatory Visit | Attending: Orthopedic Surgery

## 2014-05-06 DIAGNOSIS — Z87891 Personal history of nicotine dependence: Secondary | ICD-10-CM | POA: Insufficient documentation

## 2014-05-06 DIAGNOSIS — Z8673 Personal history of transient ischemic attack (TIA), and cerebral infarction without residual deficits: Secondary | ICD-10-CM | POA: Diagnosis not present

## 2014-05-06 DIAGNOSIS — W19XXXA Unspecified fall, initial encounter: Secondary | ICD-10-CM | POA: Insufficient documentation

## 2014-05-06 DIAGNOSIS — I739 Peripheral vascular disease, unspecified: Secondary | ICD-10-CM | POA: Diagnosis not present

## 2014-05-06 DIAGNOSIS — S52571A Other intraarticular fracture of lower end of right radius, initial encounter for closed fracture: Secondary | ICD-10-CM | POA: Diagnosis present

## 2014-05-06 DIAGNOSIS — E039 Hypothyroidism, unspecified: Secondary | ICD-10-CM | POA: Diagnosis not present

## 2014-05-06 HISTORY — PX: OPEN REDUCTION INTERNAL FIXATION (ORIF) DISTAL RADIAL FRACTURE: SHX5989

## 2014-05-06 SURGERY — OPEN REDUCTION INTERNAL FIXATION (ORIF) DISTAL RADIUS FRACTURE
Anesthesia: General | Laterality: Left

## 2014-05-06 MED ORDER — PROPOFOL 10 MG/ML IV BOLUS
INTRAVENOUS | Status: DC | PRN
  Administered 2014-05-06: 200 mg via INTRAVENOUS

## 2014-05-06 MED ORDER — 0.9 % SODIUM CHLORIDE (POUR BTL) OPTIME
TOPICAL | Status: DC | PRN
  Administered 2014-05-06: 1000 mL

## 2014-05-06 MED ORDER — MIDAZOLAM HCL 2 MG/2ML IJ SOLN
INTRAMUSCULAR | Status: AC
  Filled 2014-05-06: qty 2

## 2014-05-06 MED ORDER — PHENYLEPHRINE 40 MCG/ML (10ML) SYRINGE FOR IV PUSH (FOR BLOOD PRESSURE SUPPORT)
PREFILLED_SYRINGE | INTRAVENOUS | Status: AC
  Filled 2014-05-06: qty 10

## 2014-05-06 MED ORDER — LIDOCAINE HCL (CARDIAC) 20 MG/ML IV SOLN
INTRAVENOUS | Status: AC
  Filled 2014-05-06: qty 5

## 2014-05-06 MED ORDER — MIDAZOLAM HCL 5 MG/5ML IJ SOLN
INTRAMUSCULAR | Status: DC | PRN
  Administered 2014-05-06: 2 mg via INTRAVENOUS

## 2014-05-06 MED ORDER — ONDANSETRON HCL 4 MG/2ML IJ SOLN
INTRAMUSCULAR | Status: AC
  Filled 2014-05-06: qty 2

## 2014-05-06 MED ORDER — PHENYLEPHRINE HCL 10 MG/ML IJ SOLN
INTRAMUSCULAR | Status: DC | PRN
  Administered 2014-05-06 (×3): 80 ug via INTRAVENOUS

## 2014-05-06 MED ORDER — HYDROMORPHONE HCL 1 MG/ML IJ SOLN: 0.2500 mg | INTRAMUSCULAR | Status: DC | PRN

## 2014-05-06 MED ORDER — LACTATED RINGERS IV SOLN
INTRAVENOUS | Status: DC | PRN
  Administered 2014-05-06 (×2): via INTRAVENOUS

## 2014-05-06 MED ORDER — OXYCODONE HCL 5 MG PO TABS: 5.0000 mg | ORAL_TABLET | Freq: Once | ORAL | Status: DC | PRN

## 2014-05-06 MED ORDER — ROCURONIUM BROMIDE 50 MG/5ML IV SOLN
INTRAVENOUS | Status: AC
  Filled 2014-05-06: qty 1

## 2014-05-06 MED ORDER — DEXAMETHASONE SODIUM PHOSPHATE 10 MG/ML IJ SOLN
INTRAMUSCULAR | Status: DC | PRN
  Administered 2014-05-06: 8 mg via INTRAVENOUS

## 2014-05-06 MED ORDER — FENTANYL CITRATE (PF) 100 MCG/2ML IJ SOLN
INTRAMUSCULAR | Status: DC | PRN
  Administered 2014-05-06: 50 ug via INTRAVENOUS

## 2014-05-06 MED ORDER — PROMETHAZINE HCL 25 MG/ML IJ SOLN: 6.2500 mg | INTRAMUSCULAR | Status: DC | PRN

## 2014-05-06 MED ORDER — CEFAZOLIN SODIUM 1-5 GM-% IV SOLN
INTRAVENOUS | Status: AC
  Administered 2014-05-06: 1 g via INTRAVENOUS
  Filled 2014-05-06: qty 50

## 2014-05-06 MED ORDER — BUPIVACAINE-EPINEPHRINE (PF) 0.5% -1:200000 IJ SOLN
INTRAMUSCULAR | Status: DC | PRN
  Administered 2014-05-06: 25 mL via PERINEURAL

## 2014-05-06 MED ORDER — FENTANYL CITRATE (PF) 250 MCG/5ML IJ SOLN
INTRAMUSCULAR | Status: AC
  Filled 2014-05-06: qty 5

## 2014-05-06 MED ORDER — OXYCODONE HCL 5 MG/5ML PO SOLN: 5.0000 mg | Freq: Once | ORAL | Status: DC | PRN

## 2014-05-06 MED ORDER — PROPOFOL 10 MG/ML IV BOLUS
INTRAVENOUS | Status: AC
  Filled 2014-05-06: qty 20

## 2014-05-06 MED ORDER — SUCCINYLCHOLINE CHLORIDE 20 MG/ML IJ SOLN
INTRAMUSCULAR | Status: AC
  Filled 2014-05-06: qty 1

## 2014-05-06 MED ORDER — DEXAMETHASONE SODIUM PHOSPHATE 4 MG/ML IJ SOLN
INTRAMUSCULAR | Status: AC
  Filled 2014-05-06: qty 2

## 2014-05-06 SURGICAL SUPPLY — 67 items
BANDAGE ELASTIC 3 VELCRO ST LF (GAUZE/BANDAGES/DRESSINGS) ×3 IMPLANT
BANDAGE ELASTIC 4 VELCRO ST LF (GAUZE/BANDAGES/DRESSINGS) ×3 IMPLANT
BIT DRILL 2.2 SS TIBIAL (BIT) ×3 IMPLANT
BLADE SURG ROTATE 9660 (MISCELLANEOUS) IMPLANT
BNDG ESMARK 4X9 LF (GAUZE/BANDAGES/DRESSINGS) ×3 IMPLANT
BNDG GAUZE ELAST 4 BULKY (GAUZE/BANDAGES/DRESSINGS) ×6 IMPLANT
CANISTER SUCTION 2500CC (MISCELLANEOUS) ×3 IMPLANT
CAST PADDING SYN 3 (CAST SUPPLIES) ×3 IMPLANT
CLOSURE WOUND 1/2 X4 (GAUZE/BANDAGES/DRESSINGS)
CORDS BIPOLAR (ELECTRODE) ×3 IMPLANT
COVER SURGICAL LIGHT HANDLE (MISCELLANEOUS) ×3 IMPLANT
CUFF TOURNIQUET SINGLE 18IN (TOURNIQUET CUFF) ×3 IMPLANT
CUFF TOURNIQUET SINGLE 24IN (TOURNIQUET CUFF) IMPLANT
DRAIN TLS ROUND 10FR (DRAIN) IMPLANT
DRAPE OEC MINIVIEW 54X84 (DRAPES) ×3 IMPLANT
DRAPE SURG 17X11 SM STRL (DRAPES) ×3 IMPLANT
DRSG ADAPTIC 3X8 NADH LF (GAUZE/BANDAGES/DRESSINGS) ×3 IMPLANT
ELECT REM PT RETURN 9FT ADLT (ELECTROSURGICAL)
ELECTRODE REM PT RTRN 9FT ADLT (ELECTROSURGICAL) IMPLANT
GAUZE SPONGE 4X4 12PLY STRL (GAUZE/BANDAGES/DRESSINGS) ×3 IMPLANT
GAUZE SPONGE 4X4 16PLY XRAY LF (GAUZE/BANDAGES/DRESSINGS) ×3 IMPLANT
GLOVE BIOGEL PI IND STRL 8.5 (GLOVE) ×1 IMPLANT
GLOVE BIOGEL PI INDICATOR 8.5 (GLOVE) ×2
GLOVE SURG ORTHO 8.0 STRL STRW (GLOVE) ×3 IMPLANT
GOWN STRL REUS W/ TWL LRG LVL3 (GOWN DISPOSABLE) ×1 IMPLANT
GOWN STRL REUS W/ TWL XL LVL3 (GOWN DISPOSABLE) ×1 IMPLANT
GOWN STRL REUS W/TWL LRG LVL3 (GOWN DISPOSABLE) ×2
GOWN STRL REUS W/TWL XL LVL3 (GOWN DISPOSABLE) ×2
K-WIRE 1.6 (WIRE) ×2
K-WIRE FX5X1.6XNS BN SS (WIRE) ×1
KIT BASIN OR (CUSTOM PROCEDURE TRAY) ×3 IMPLANT
KIT ROOM TURNOVER OR (KITS) ×3 IMPLANT
KWIRE FX5X1.6XNS BN SS (WIRE) ×1 IMPLANT
MANIFOLD NEPTUNE II (INSTRUMENTS) ×3 IMPLANT
NEEDLE HYPO 25X1 1.5 SAFETY (NEEDLE) ×3 IMPLANT
NS IRRIG 1000ML POUR BTL (IV SOLUTION) ×3 IMPLANT
PACK ORTHO EXTREMITY (CUSTOM PROCEDURE TRAY) ×3 IMPLANT
PAD ARMBOARD 7.5X6 YLW CONV (MISCELLANEOUS) ×6 IMPLANT
PAD CAST 4YDX4 CTTN HI CHSV (CAST SUPPLIES) ×1 IMPLANT
PADDING CAST COTTON 4X4 STRL (CAST SUPPLIES) ×2
PEG LOCKING SMOOTH 2.2X18 (Peg) ×6 IMPLANT
PEG LOCKING SMOOTH 2.2X20 (Screw) ×12 IMPLANT
PLATE NARROW DVR RIGHT (Plate) ×3 IMPLANT
SCREW LOCK 12X2.7X 3 LD (Screw) ×2 IMPLANT
SCREW LOCK 14X2.7X 3 LD TPR (Screw) ×1 IMPLANT
SCREW LOCKING 2.7X12MM (Screw) ×4 IMPLANT
SCREW LOCKING 2.7X13MM (Screw) ×6 IMPLANT
SCREW LOCKING 2.7X14 (Screw) ×2 IMPLANT
SOAP 2 % CHG 4 OZ (WOUND CARE) ×3 IMPLANT
SPLINT FIBERGLASS 3X35 (CAST SUPPLIES) ×3 IMPLANT
SPONGE GAUZE 4X4 12PLY STER LF (GAUZE/BANDAGES/DRESSINGS) ×3 IMPLANT
SPONGE LAP 4X18 X RAY DECT (DISPOSABLE) ×3 IMPLANT
STRIP CLOSURE SKIN 1/2X4 (GAUZE/BANDAGES/DRESSINGS) IMPLANT
SUT ETHILON 4 0 PS 2 18 (SUTURE) IMPLANT
SUT MNCRL AB 4-0 PS2 18 (SUTURE) IMPLANT
SUT PROLENE 4 0 P 3 18 (SUTURE) ×3 IMPLANT
SUT PROLENE 4 0 PS 2 18 (SUTURE) ×3 IMPLANT
SUT VIC AB 2-0 FS1 27 (SUTURE) ×3 IMPLANT
SUT VICRYL 4-0 PS2 18IN ABS (SUTURE) ×3 IMPLANT
SYR CONTROL 10ML LL (SYRINGE) IMPLANT
SYSTEM CHEST DRAIN TLS 7FR (DRAIN) IMPLANT
TOWEL OR 17X24 6PK STRL BLUE (TOWEL DISPOSABLE) ×3 IMPLANT
TOWEL OR 17X26 10 PK STRL BLUE (TOWEL DISPOSABLE) ×3 IMPLANT
TUBE CONNECTING 12'X1/4 (SUCTIONS) ×1
TUBE CONNECTING 12X1/4 (SUCTIONS) ×2 IMPLANT
WATER STERILE IRR 1000ML POUR (IV SOLUTION) ×3 IMPLANT
YANKAUER SUCT BULB TIP NO VENT (SUCTIONS) IMPLANT

## 2014-05-06 NOTE — Discharge Instructions (Signed)
KEEP BANDAGE CLEAN AND DRY °CALL OFFICE FOR F/U APPT [phone] °DR Cesily Cuoco CELL [phone] °KEEP HAND ELEVATED ABOVE HEART °OK TO APPLY ICE TO OPERATIVE AREA °CONTACT OFFICE IF ANY WORSENING PAIN OR CONCERNS. °

## 2014-05-06 NOTE — H&P (Signed)
[name] is an 64 y.o. female.   Chief Complaint: fall on right wrist  HPI: Right wrist injury after fall Pt with no prior injury to right wrist Pt RHD  Past Medical History  Diagnosis Date  . Hypothyroidism     on synthroid   . Stroke, embolic 1497    from PFO    Past Surgical History  Procedure Laterality Date  . Patent foramen ovale closure  2005    Family History  Problem Relation Age of Onset  . Heart disease Mother   . Hypertension Mother   . Diabetes Neg Hx    Social History:  reports that she quit smoking about 4 years ago. Her smoking use included Cigarettes. She has never used smokeless tobacco. She reports that she drinks about 2.0 oz of alcohol per week. Her drug history is not on file.  Allergies: No Known Allergies  Medications Prior to Admission  Medication Sig Dispense Refill  . estradiol-norethindrone (ACTIVELLA) 1-0.5 MG per tablet Take 1 tablet by mouth daily.    Marland Kitchen levothyroxine (SYNTHROID, LEVOTHROID) 88 MCG tablet Take 88 mcg by mouth daily before breakfast.    . oxyCODONE-acetaminophen (PERCOCET/ROXICET) 5-325 MG per tablet Take 1 tablet by mouth every 6 (six) hours as needed for severe pain. 4 tablet 0  . SYNTHROID 75 MCG tablet Take 75 mcg by mouth.       No results found for this or any previous visit (from the past 48 hour(s)). Dg Lumbar Spine Complete  05/05/2014   CLINICAL DATA:  [place] off bed today.  Injured right wrist and back.  EXAM: RIGHT WRIST - COMPLETE 3+ VIEW; LUMBAR SPINE - COMPLETE 4+ VIEW  COMPARISON:  Lumbar spine MRI 01/11/2012  FINDINGS: Right wrist:  There is a comminuted dorsally impacted intra-articular fracture of the distal radius. No definite ulnar styloid fracture. The carpal bones are intact.  Lumbar spine:  Mild right convex lumbar scoliosis and moderate degenerative lumbar spondylosis with advanced disc disease and facet disease at L4-5. No acute fracture is identified.  IMPRESSION: Dorsally impacted distal radius  fracture (collies fracture).  Advanced degenerative disc disease at L4-5 but no acute findings in the lumbar spine.   Electronically Signed   By: Marijo Sanes M.D.   On: 05/05/2014 18:08   Dg Wrist Complete Right  05/05/2014   CLINICAL DATA:  [place] off bed today.  Injured right wrist and back.  EXAM: RIGHT WRIST - COMPLETE 3+ VIEW; LUMBAR SPINE - COMPLETE 4+ VIEW  COMPARISON:  Lumbar spine MRI 01/11/2012  FINDINGS: Right wrist:  There is a comminuted dorsally impacted intra-articular fracture of the distal radius. No definite ulnar styloid fracture. The carpal bones are intact.  Lumbar spine:  Mild right convex lumbar scoliosis and moderate degenerative lumbar spondylosis with advanced disc disease and facet disease at L4-5. No acute fracture is identified.  IMPRESSION: Dorsally impacted distal radius fracture (collies fracture).  Advanced degenerative disc disease at L4-5 but no acute findings in the lumbar spine.   Electronically Signed   By: Marijo Sanes M.D.   On: 05/05/2014 18:08    ROS NO RECENT ILLNESSES OR HOSPITALIZATIONS  There were no vitals taken for this visit. Physical Exam  General Appearance:  Alert, cooperative, no distress, appears stated age  Head:  Normocephalic, without obvious abnormality, atraumatic  Eyes:  Pupils equal, conjunctiva/corneas clear,         Throat: Lips, mucosa, and tongue normal; teeth and gums normal  Neck: No visible  masses     Lungs:   respirations unlabored  Chest Wall:  No tenderness or deformity  Heart:  Regular rate and rhythm,  Abdomen:   Soft, non-tender,         Extremities: RIGHT WRIST: SUGARTONG SPLINT IN PLACE FINGERS WARM WELL PERFUSED GOOD DIGITAL MOTION, ABLE TO EXTEND THUMB  Pulses: 2+ and symmetric  Skin: Skin color, texture, turgor normal, no rashes or lesions     Neurologic: Normal  Assessment/Plan RIGHT DISTAL RADIUS FRACTURE DISPLACED AND ANGULATED, CLOSED  RIGHT DISTAL RADIUS OPEN REDUCTION AND INTERNAL FIXATION AND  REPAIR AS INDICATED  R/B/A DISCUSSED WITH PT IN HOSPITAL.  PT VOICED UNDERSTANDING OF PLAN CONSENT SIGNED DAY OF SURGERY PT SEEN AND EXAMINED PRIOR TO OPERATIVE PROCEDURE/DAY OF SURGERY SITE MARKED. QUESTIONS ANSWERED WILL GO HOME FOLLOWING SURGERY  WE ARE PLANNING SURGERY FOR YOUR UPPER EXTREMITY. THE RISKS AND BENEFITS OF SURGERY INCLUDE BUT NOT LIMITED TO BLEEDING INFECTION, DAMAGE TO NEARBY NERVES ARTERIES TENDONS, FAILURE OF SURGERY TO ACCOMPLISH ITS INTENDED GOALS, PERSISTENT SYMPTOMS AND NEED FOR FURTHER SURGICAL INTERVENTION. WITH THIS IN MIND WE WILL PROCEED. I HAVE DISCUSSED WITH THE PATIENT THE PRE AND POSTOPERATIVE REGIMEN AND THE DOS AND DON'TS. PT VOICED UNDERSTANDING AND INFORMED CONSENT SIGNED.   Linna Hoff 05/06/2014, 7:56 AM

## 2014-05-06 NOTE — Anesthesia Postprocedure Evaluation (Signed)
  Anesthesia Post-op Note  Patient: [name]  Procedure(s) Performed: Procedure(s): OPEN REDUCTION INTERNAL FIXATION (ORIF) DISTAL RADIUS FRACTURE (Left)  Patient Location: PACU  Anesthesia Type:GA combined with regional for post-op pain  Level of Consciousness: awake and alert   Airway and Oxygen Therapy: Patient Spontanous Breathing  Post-op Pain: none  Post-op Assessment: Post-op Vital signs reviewed  Post-op Vital Signs: Reviewed  Last Vitals:  Filed Vitals:   05/06/14 1006  BP: 107/68  Pulse: 63  Temp:   Resp: 16    Complications: No apparent anesthesia complications

## 2014-05-06 NOTE — Anesthesia Preprocedure Evaluation (Addendum)
Anesthesia Evaluation  Patient identified by MRN, date of birth, ID band Patient awake    Reviewed: Allergy & Precautions, NPO status , Patient's Chart, lab work & pertinent test results  Airway Mallampati: II  TM Distance: >3 FB Neck ROM: Full    Dental  (+) Teeth Intact, Dental Advidsory Given   Pulmonary former smoker,  breath sounds clear to auscultation        Cardiovascular + Peripheral Vascular Disease Rhythm:Regular Rate:Normal  Hx PFO closure   Neuro/Psych CVA, Residual Symptoms    GI/Hepatic negative GI ROS, Neg liver ROS,   Endo/Other  Hypothyroidism   Renal/GU negative Renal ROS     Musculoskeletal negative musculoskeletal ROS (+)   Abdominal   Peds  Hematology negative hematology ROS (+)   Anesthesia Other Findings Mild left facial weakness from stroke.  Reproductive/Obstetrics                            Anesthesia Physical Anesthesia Plan  ASA: II  Anesthesia Plan: General and Regional   Post-op Pain Management:    Induction:   Airway Management Planned: LMA  Additional Equipment:   Intra-op Plan:   Post-operative Plan:   Informed Consent:   Dental Advisory Given  Plan Discussed with: CRNA  Anesthesia Plan Comments:        Anesthesia Quick Evaluation

## 2014-05-06 NOTE — Anesthesia Procedure Notes (Addendum)
Procedure Name: LMA Insertion Date/Time: 05/06/2014 8:05 AM Performed by: Neldon Newport Pre-anesthesia Checklist: Patient being monitored, Suction available, Emergency Drugs available, Patient identified and Timeout performed Patient Re-evaluated:Patient Re-evaluated prior to inductionOxygen Delivery Method: Circle system utilized Preoxygenation: Pre-oxygenation with 100% oxygen Intubation Type: IV induction Ventilation: Mask ventilation without difficulty LMA: LMA inserted LMA Size: 3.0 Number of attempts: 2 Placement Confirmation: positive ETCO2 and breath sounds checked- equal and bilateral Tube secured with: Tape Dental Injury: Teeth and Oropharynx as per pre-operative assessment     Anesthesia Regional Block:  Supraclavicular block  Pre-Anesthetic Checklist: ,, timeout performed, Correct Patient, Correct Site, Correct Laterality, Correct Procedure, Correct Position, site marked, Risks and benefits discussed,  Surgical consent,  Pre-op evaluation,  At surgeon's request and post-op pain management  Laterality: Right  Prep: chloraprep       Needles:  Injection technique: Single-shot  Needle Type: Echogenic Stimulator Needle     Needle Length: 9cm 9 cm Needle Gauge: 21 and 21 G    Additional Needles:  Procedures: ultrasound guided (picture in chart) and nerve stimulator Supraclavicular block  Nerve Stimulator or Paresthesia:  Response: biceps, 0.7 mA,  Response: tricep, 0.7 mA,   Additional Responses:   Narrative:  Start time: 05/06/2014 7:45 AM End time: 05/06/2014 7:55 AM Injection made incrementally with aspirations every 5 mL.  Performed by: Personally  Anesthesiologist: Suzette Battiest  Additional Notes: Risks, benefits and alternative to block explained extensively.  Patient tolerated procedure well, without complications.

## 2014-05-06 NOTE — Transfer of Care (Signed)
Immediate Anesthesia Transfer of Care Note  Patient: [name]  Procedure(s) Performed: Procedure(s): OPEN REDUCTION INTERNAL FIXATION (ORIF) DISTAL RADIUS FRACTURE (Left)  Patient Location: PACU  Anesthesia Type:General  Level of Consciousness: awake, alert  and oriented  Airway & Oxygen Therapy: Patient Spontanous Breathing and Patient connected to nasal cannula oxygen  Post-op Assessment: Report given to RN, Post -op Vital signs reviewed and stable and Patient moving all extremities X 4  Post vital signs: Reviewed and stable  Last Vitals: There were no vitals filed for this visit.  Complications: No apparent anesthesia complications

## 2014-05-06 NOTE — Op Note (Signed)
PREOPERATIVE DIAGNOSIS: RIGHT wrist intra-articular distal radius  fracture, 2 or more fragments.   POSTOPERATIVE DIAGNOSIS: RIGHT wrist intra-articular distal radius  fracture, 2 or more fragments.   ATTENDING PHYSICIAN: Linna Hoff IV, MD who scrubbed and present  entire procedure.   ASSISTANT SURGEON: None.   ANESTHESIA: Supraclavicular block performed by Dr. Conrad [place] and general  Anesthesia.   SURGICAL IMPLANTS: DVR CROSS LOCK NARROW, BIOMET  SURGICAL PROCEDURE:  1. Open treatment of Right wrist intra-articular distal radius  fracture, 2 or more fragments.  2. Right wrist brachioradialis tenotomy and release.  3. Radiographs, right wrist.   SURGICAL INDICATIONS: [name]  is a right-hand-dominant female  sustained an intra-articular distal radius fracture after a fall. The  patient was seen and evaluated in the office based on degree of  displacement and the volar displacement, recommended that she undergo  the above procedure. Risks, benefits, and alternatives were discussed  in detail with the patient. Signed informed consent was obtained.  Risks include, but not limited to bleeding, infection, damage to nearby  nerves, arteries, or tendons, nonunion, malunion, hardware failure, loss  of motion of the elbow, wrist, and digits, and need for further surgical  intervention.   PROCEDURE: The patient was properly identified in the preop holding  area. A mark with a permanent marker was made on the right wrist to  indicate correct operative site. The patient tolerated the block  performed by Anesthesia. The patient was then brought back to the  operating room. The patient received preoperative antibiotics. General  anesthesia was induced. Right upper extremity was prepped and draped in  normal sterile fashion. Time-out was called. Correct site was  identified, and procedure then begun. Attention was then turned to the  left wrist. The limb was then elevated using Esmarch  exsanguination and  tourniquet insufflated. A longitudinal incision was made directly over  the FCR sheath. Dissection was then carried down through the skin and  subcutaneous tissue. The FCR sheath was then opened proximally and  distally. Careful dissection was done going through the floor of the  FCR sheath where the FPL was identified. An L-shaped pronator quadratus  flap was then elevated. In order to aiding reduction of the radial column the brachioradialis was  then released, careful dissection was then carried out to release and  tenotomize the brachioradialis off the radial styloid and make sure to  Protect the 1st dorsal compartment tendons.The fracture site was then opened and the  patient did have a fracture lin extending into the joint. Careful open  reduction was then carried out.Narrow DVR Cross Lock plate was  then applied. The oblong screw hole was then drilled with a 2.2 mm  drill bit, then 2.4 mm bicortical screw. Plate height was adjusted.  After position was then confirmed using mini C-arm, the distal row  fixation was then carried out with the beginning from an ulnar to radial  direction with the  locking pegs.   The total of 6 locking pegs were then placed. Following this, attention  was then turned proximally where 2 more locking screws and 2 non Locking screws were placed. The wound was then  thoroughly irrigated. Final radiography was then carried out with the Mini C-arm  The pronator quadratus was then closed with 2-0 Vicryl.  Tourniquet was then deflated. Hemostasis was then obtained. The  subcutaneous tissues closed with 4-0 Vicryl and skin closed with simple  nylon sutures. Adaptic dressing and sterile compressive bandage was  then  applied. The patient was then placed in a well-padded sugar-tong  splint. Extubated and taken to recovery room in good condition.    Intraoperative radiographs, 3 views of the wrist do show  the volar plate fixation in place.  There is good position in both  planes.  Stress radiographs were then  obtained under live fluoro showing no widening of the SL interval. I  did not see any carpal dissociation with good fixation, without any  evidence of penetration in the articular margin with the locking pegs.    POSTOPERATIVE PLAN: The patient will be discharged to home. Seen back in the office for  approximately 10-14 days for wound check, suture removal, and then x-  rays, short-arm cast for total 4 weeks, and then begin a therapy regimen  around a 4-week mark. Radiographs at each visit.   Ann Nakayama, MD

## 2014-05-07 ENCOUNTER — Encounter (HOSPITAL_COMMUNITY): Payer: Self-pay | Admitting: Orthopedic Surgery

## 2014-08-16 ENCOUNTER — Encounter: Payer: Self-pay | Admitting: Gastroenterology

## 2014-10-15 ENCOUNTER — Ambulatory Visit (AMBULATORY_SURGERY_CENTER): Payer: Self-pay

## 2014-10-15 VITALS — Ht 60.0 in | Wt 101.8 lb

## 2014-10-15 DIAGNOSIS — Z1211 Encounter for screening for malignant neoplasm of colon: Secondary | ICD-10-CM

## 2014-10-15 NOTE — Progress Notes (Signed)
No allergies to eggs or soy No past problems with anesthesia No home oxygen No diet/weight loss meds  Refused emmi 

## 2014-10-16 ENCOUNTER — Other Ambulatory Visit: Payer: Self-pay

## 2014-10-16 DIAGNOSIS — Z1211 Encounter for screening for malignant neoplasm of colon: Secondary | ICD-10-CM

## 2014-10-16 MED ORDER — SUPREP BOWEL PREP KIT 17.5-3.13-1.6 GM/177ML PO SOLN: 1.0000 | Freq: Once | ORAL | Status: DC

## 2014-10-29 ENCOUNTER — Encounter: Payer: Managed Care, Other (non HMO) | Admitting: Gastroenterology

## 2014-12-21 ENCOUNTER — Encounter: Payer: Self-pay | Admitting: Gastroenterology

## 2014-12-21 ENCOUNTER — Ambulatory Visit (AMBULATORY_SURGERY_CENTER): Payer: Managed Care, Other (non HMO) | Admitting: Gastroenterology

## 2014-12-21 VITALS — BP 85/40 | HR 54 | Temp 97.9°F | Resp 28 | Ht 60.0 in | Wt 101.0 lb

## 2014-12-21 DIAGNOSIS — D125 Benign neoplasm of sigmoid colon: Secondary | ICD-10-CM | POA: Diagnosis not present

## 2014-12-21 DIAGNOSIS — Z1211 Encounter for screening for malignant neoplasm of colon: Secondary | ICD-10-CM

## 2014-12-21 MED ORDER — SODIUM CHLORIDE 0.9 % IV SOLN: 500.0000 mL | INTRAVENOUS | Status: DC

## 2014-12-21 NOTE — Patient Instructions (Signed)
YOU HAD AN ENDOSCOPIC PROCEDURE TODAY AT THE [facility]:   Refer to the procedure report that was given to you for any specific questions about what was found during the examination.  If the procedure report does not answer your questions, please call your gastroenterologist to clarify.  If you requested that your care partner not be given the details of your procedure findings, then the procedure report has been included in a sealed envelope for you to review at your convenience later.  YOU SHOULD EXPECT: Some feelings of bloating in the abdomen. Passage of more gas than usual.  Walking can help get rid of the air that was put into your GI tract during the procedure and reduce the bloating. If you had a lower endoscopy (such as a colonoscopy or flexible sigmoidoscopy) you may notice spotting of blood in your stool or on the toilet paper. If you underwent a bowel prep for your procedure, you may not have a normal bowel movement for a few days.  Please Note:  You might notice some irritation and congestion in your nose or some drainage.  This is from the oxygen used during your procedure.  There is no need for concern and it should clear up in a day or so.  SYMPTOMS TO REPORT IMMEDIATELY:   Following lower endoscopy (colonoscopy or flexible sigmoidoscopy):  Excessive amounts of blood in the stool  Significant tenderness or worsening of abdominal pains  Swelling of the abdomen that is new, acute  Fever of 100F or higher   For urgent or emergent issues, a gastroenterologist can be reached at any hour by calling [phone].   DIET: Your first meal following the procedure should be a small meal and then it is ok to progress to your normal diet. Heavy or fried foods are harder to digest and may make you feel nauseous or bloated.  Likewise, meals heavy in dairy and vegetables can increase bloating.  Drink plenty of fluids but you should avoid alcoholic beverages for 24  hours.  ACTIVITY:  You should plan to take it easy for the rest of today and you should NOT DRIVE or use heavy machinery until tomorrow (because of the sedation medicines used during the test).    FOLLOW UP: Our staff will call the number listed on your records the next business day following your procedure to check on you and address any questions or concerns that you may have regarding the information given to you following your procedure. If we do not reach you, we will leave a message.  However, if you are feeling well and you are not experiencing any problems, there is no need to return our call.  We will assume that you have returned to your regular daily activities without incident.  If any biopsies were taken you will be contacted by phone or by letter within the next 1-3 weeks.  Please call us at [phone] if you have not heard about the biopsies in 3 weeks.    SIGNATURES/CONFIDENTIALITY: You and/or your care partner have signed paperwork which will be entered into your electronic medical record.  These signatures attest to the fact that that the information above on your After Visit Summary has been reviewed and is understood.  Full responsibility of the confidentiality of this discharge information lies with you and/or your care-partner.  Polyp information given.  

## 2014-12-21 NOTE — Progress Notes (Signed)
Called to room to assist during endoscopic procedure.  Patient ID and intended procedure confirmed with present staff. Received instructions for my participation in the procedure from the performing physician.  

## 2014-12-21 NOTE — Progress Notes (Signed)
A/ox3 pleased with MAC, report to Jane RN 

## 2014-12-21 NOTE — Op Note (Signed)
[facility]  [address] [place], [address]   COLONOSCOPY PROCEDURE REPORT  PATIENT: Ann, King  MR#: [id number] BIRTHDATE: [birth year] , 64  yrs. old GENDER: female ENDOSCOPIST: Harl Bowie, MD REFERRED [id number] Swayne, M.D. PROCEDURE DATE:  12/21/2014 PROCEDURE:   Colonoscopy, screening and Colonoscopy with cold biopsy polypectomy First Screening Colonoscopy - Avg.  risk and is 50 yrs.  old or older - No.  Prior Negative Screening - Now for repeat screening. 10 or more years since last screening  History of Adenoma - Now for follow-up colonoscopy & has been > or = to 3 yrs.  N/A  Polyps removed today? Yes ASA CLASS:   Class II INDICATIONS:Screening for colonic neoplasia and Colorectal Neoplasm Risk Assessment for this procedure is average risk. MEDICATIONS: Propofol 250 mg IV  DESCRIPTION OF PROCEDURE:   After the risks benefits and alternatives of the procedure were thoroughly explained, informed consent was obtained.  The digital rectal exam revealed no abnormalities of the rectum.   The LB PCF Q180 [id number]  endoscope was introduced through the anus and advanced to the cecum, which was identified by both the appendix and ileocecal valve. No adverse events experienced.   The quality of the prep was good.  The instrument was then slowly withdrawn as the colon was fully examined. Estimated blood loss is zero unless otherwise noted in this procedure report.   COLON FINDINGS: A sessile polyp ranging between 3-87mm in size was found in the sigmoid colon.  A polypectomy was performed with cold forceps.  The resection was complete, the polyp tissue was completely retrieved and sent to histology.   There was mild diverticulosis noted in the sigmoid colon.   Small internal hemorrhoids were found.  Retroflexed views revealed internal hemorrhoids. The time to cecum = 17.1 Withdrawal time = 6.0   The scope was withdrawn and the procedure  completed. COMPLICATIONS: There were no immediate complications.  ENDOSCOPIC IMPRESSION: 1.   Sessile polyp ranging between 3-72mm in size was found in the sigmoid colon; polypectomy was performed with cold forceps 2.   There was mild diverticulosis noted in the sigmoid colon 3.   Small internal hemorrhoids  RECOMMENDATIONS: 1.  If the polyp(s) removed today are proven to be adenomatous (pre-cancerous) polyps, you will need a repeat colonoscopy in 5 years.  Otherwise you should continue to follow colorectal cancer screening guidelines for "routine risk" patients with colonoscopy in 10 years.  You will receive a letter within 1-2 weeks with the results of your biopsy as well as final recommendations.  Please call my office if you have not received a letter after 3 weeks. 2.  Await pathology results  eSigned:  Harl Bowie, MD 12/21/2014 9:10 AM

## 2014-12-24 ENCOUNTER — Telehealth: Payer: Self-pay | Admitting: Emergency Medicine

## 2014-12-24 NOTE — Telephone Encounter (Signed)
  Follow up Call-  Call back number 12/21/2014  Post procedure Call Back phone  # [phone]  Permission to leave phone message Yes     Patient questions:  Do you have a fever, pain , or abdominal swelling? No. Pain Score  0 *  Have you tolerated food without any problems? Yes.    Have you been able to return to your normal activities? Yes.    Do you have any questions about your discharge instructions: Diet   No. Medications  No. Follow up visit  No.  Do you have questions or concerns about your Care? No.  Actions: * If pain score is 4 or above: No action needed, pain <4.

## 2015-01-01 ENCOUNTER — Encounter: Payer: Self-pay | Admitting: Gastroenterology

## 2016-07-23 ENCOUNTER — Ambulatory Visit (INDEPENDENT_AMBULATORY_CARE_PROVIDER_SITE_OTHER): Payer: 59 | Admitting: Licensed Clinical Social Worker

## 2016-07-23 DIAGNOSIS — F4323 Adjustment disorder with mixed anxiety and depressed mood: Secondary | ICD-10-CM

## 2016-08-07 IMAGING — DX DG LUMBAR SPINE COMPLETE 4+V
5 series · 5 of 5 positions shown · non-contrast
Comparison: Lumbar spine MRI 01/11/2012

CLINICAL DATA: Fell off bed today.  Injured right wrist and back.

EXAM:
RIGHT WRIST - COMPLETE 3+ VIEW; LUMBAR SPINE - COMPLETE 4+ VIEW

[l-spine ap]
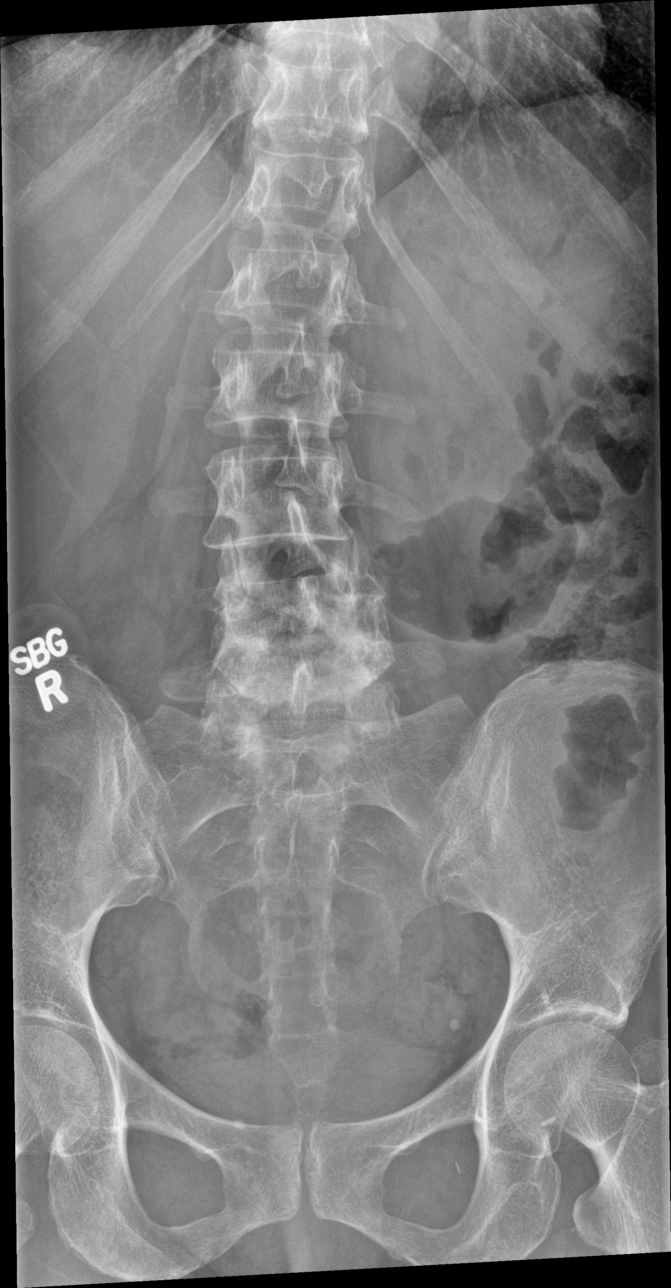

[l-spine obl (1 of 2)]
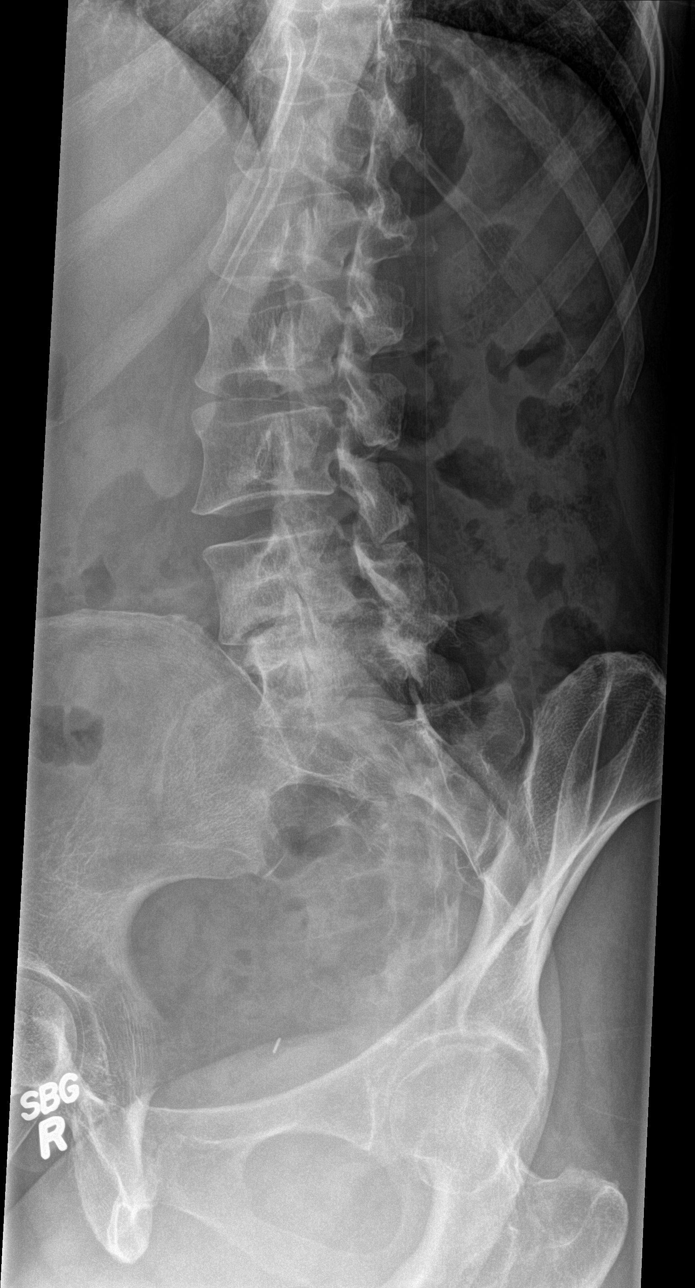

[l-spine obl (2 of 2)]
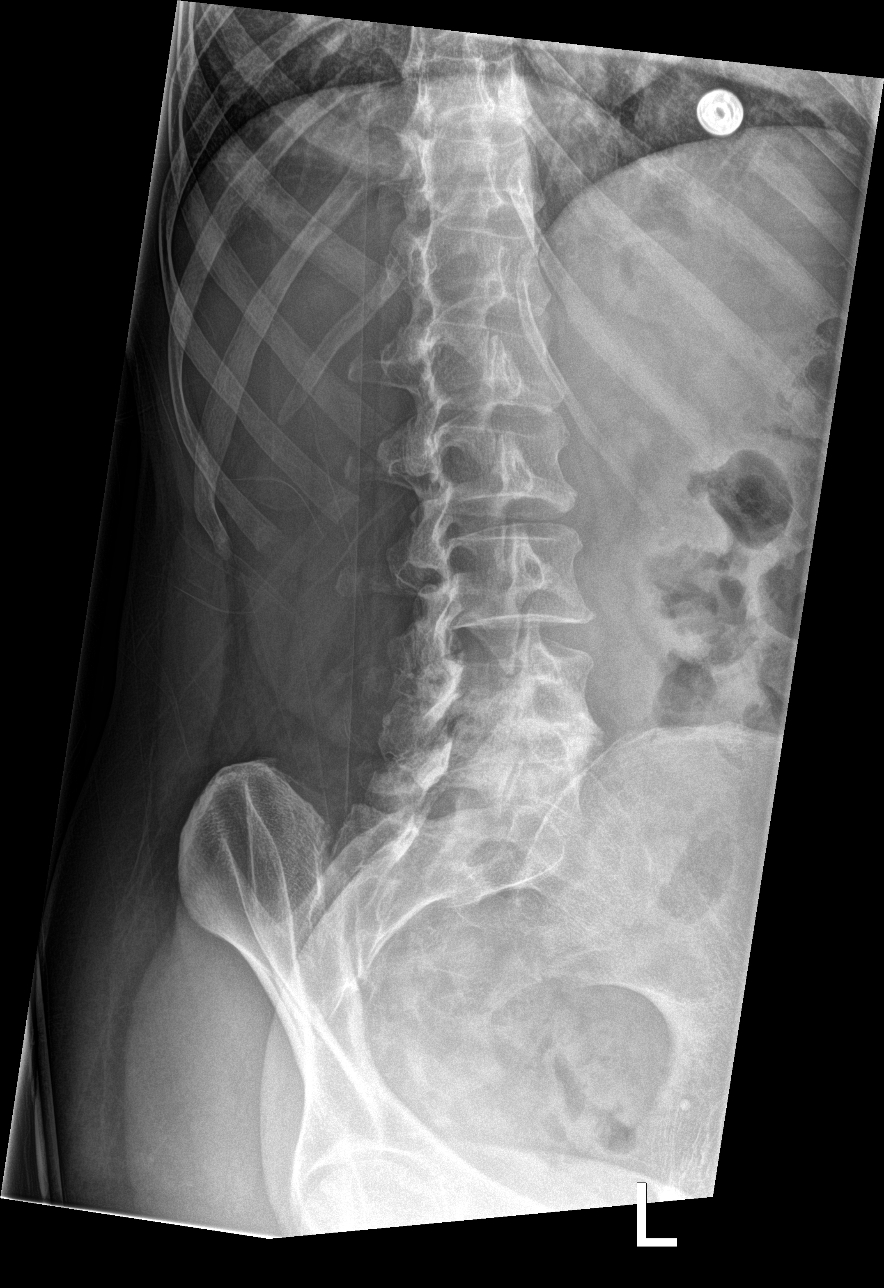

[l-spine lat]
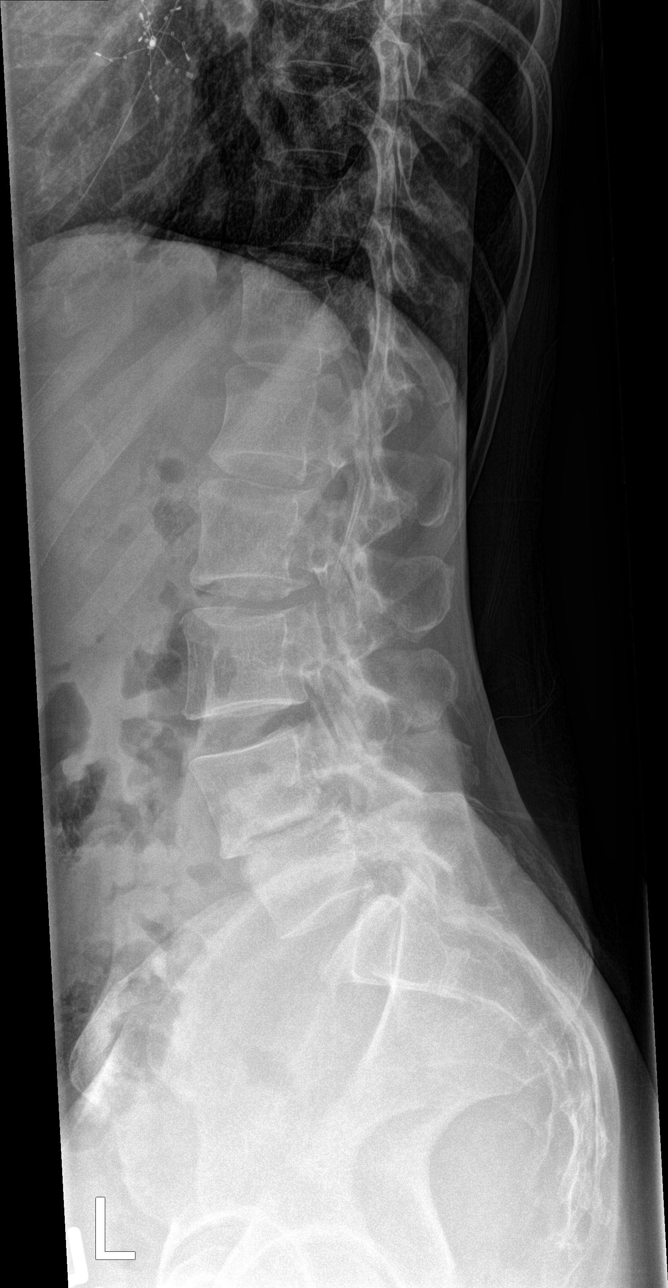

[l-spine spot]
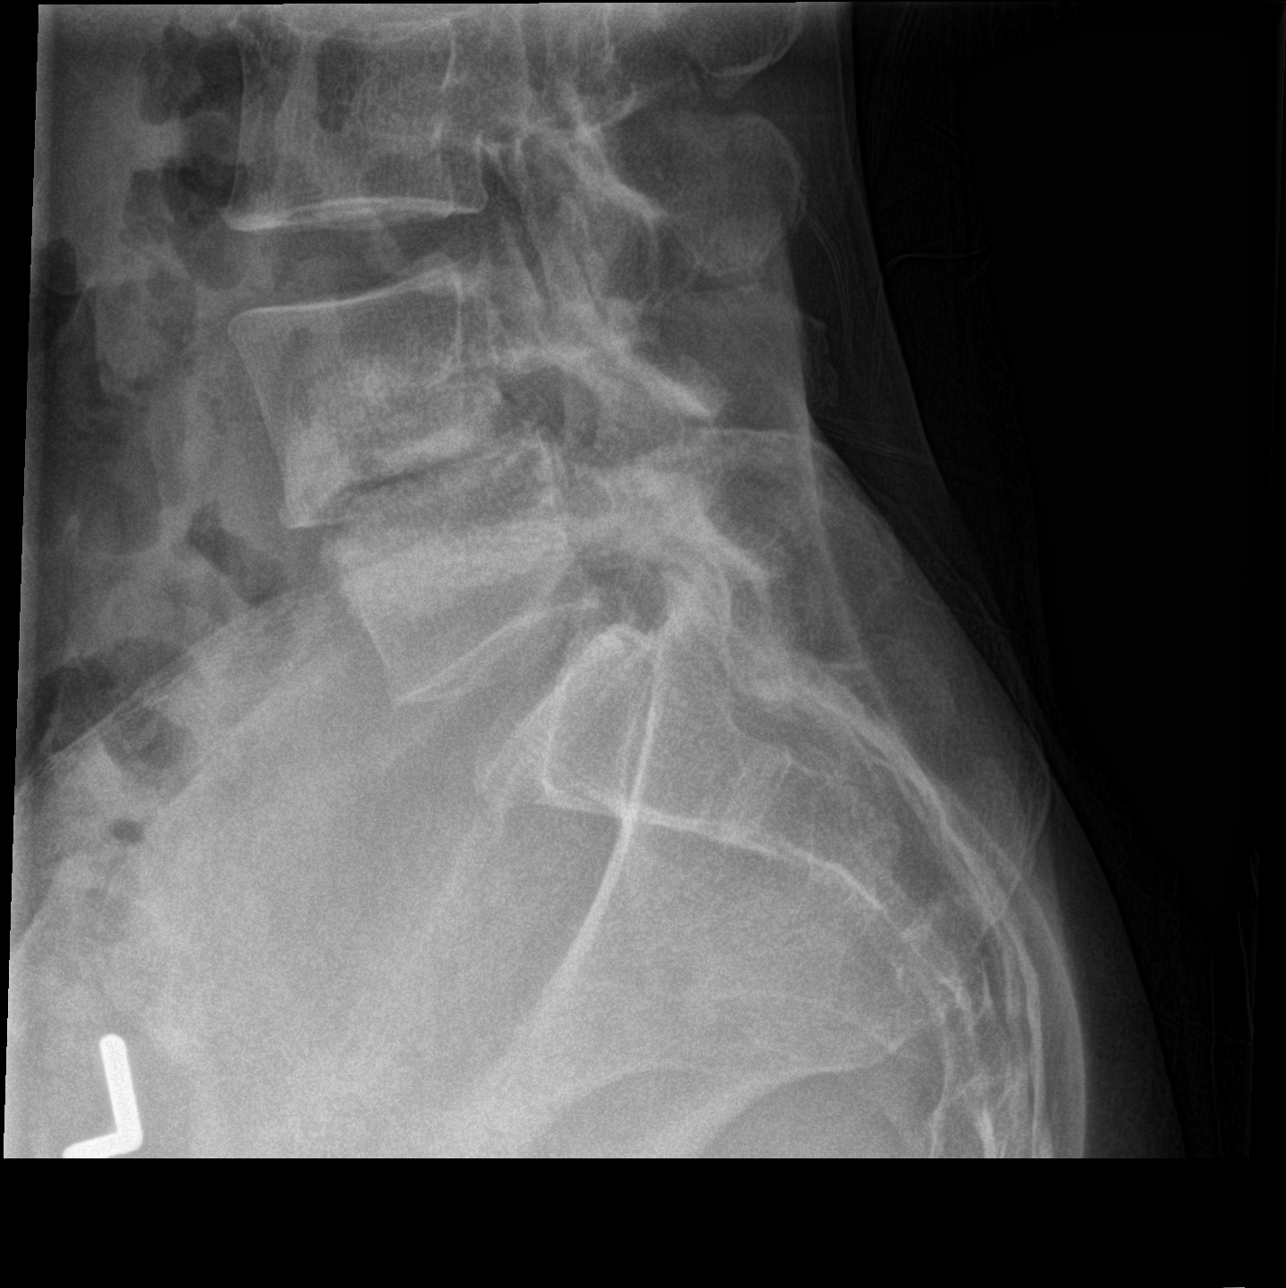

[5 of 5 positions shown; findings below may reference images not displayed]

FINDINGS: Right wrist:

There is a comminuted dorsally impacted intra-articular fracture of
the distal radius. No definite ulnar styloid fracture. The carpal
bones are intact.

Lumbar spine:

Mild right convex lumbar scoliosis and moderate degenerative lumbar
spondylosis with advanced disc disease and facet disease at L4-5. No
acute fracture is identified.
IMPRESSION: Dorsally impacted distal radius fracture (collies fracture).

Advanced degenerative disc disease at L4-5 but no acute findings in
the lumbar spine.

## 2016-08-07 IMAGING — DX DG WRIST COMPLETE 3+V*R*
4 series · 4 of 4 positions shown · non-contrast
Comparison: Lumbar spine MRI 01/11/2012

CLINICAL DATA: Fell off bed today.  Injured right wrist and back.

EXAM:
RIGHT WRIST - COMPLETE 3+ VIEW; LUMBAR SPINE - COMPLETE 4+ VIEW

[wrist pa]
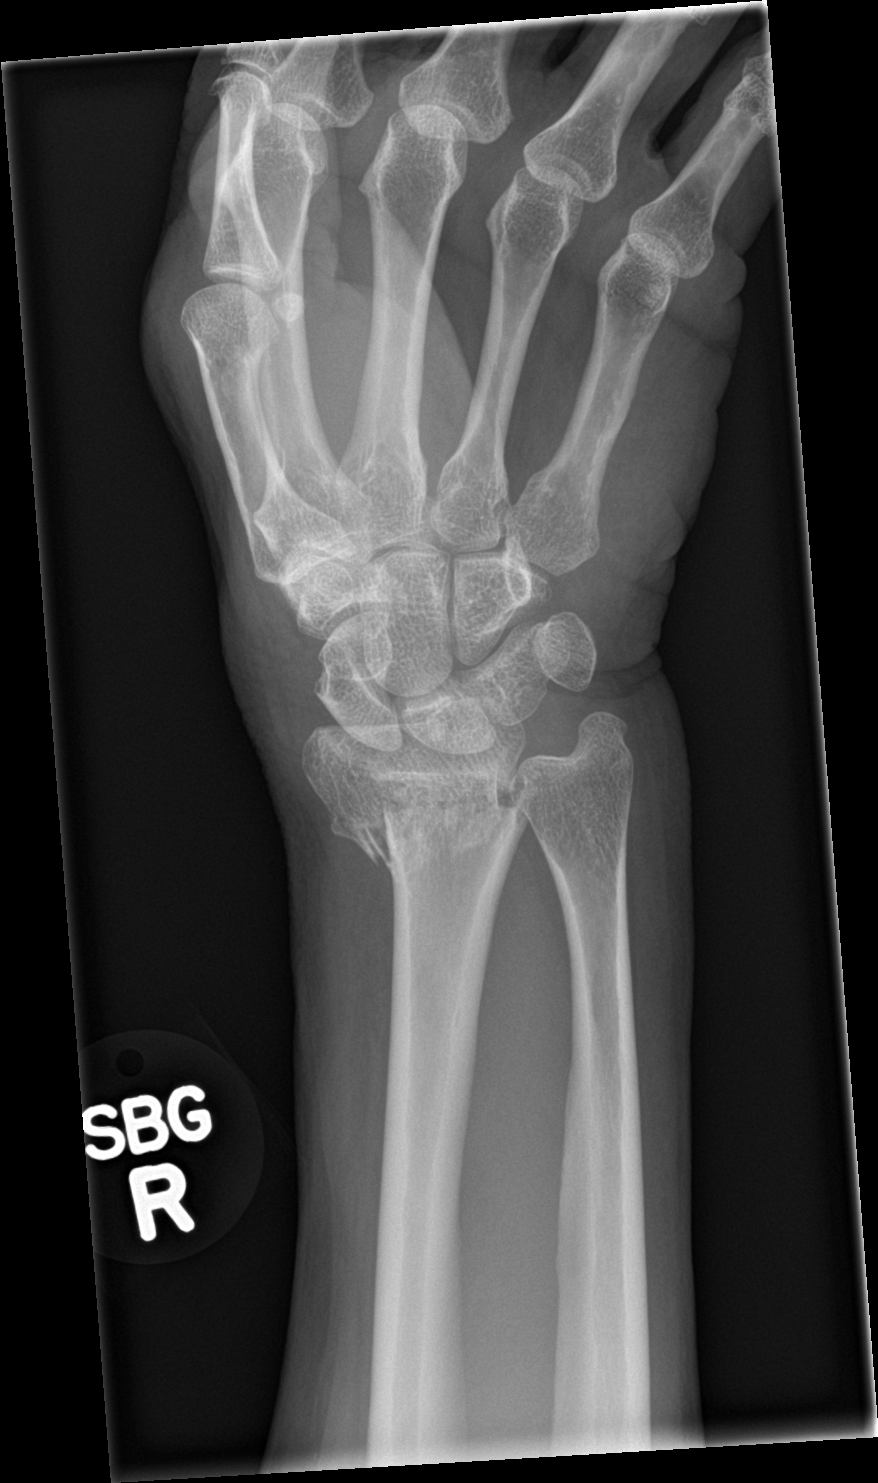

[wrist obl]
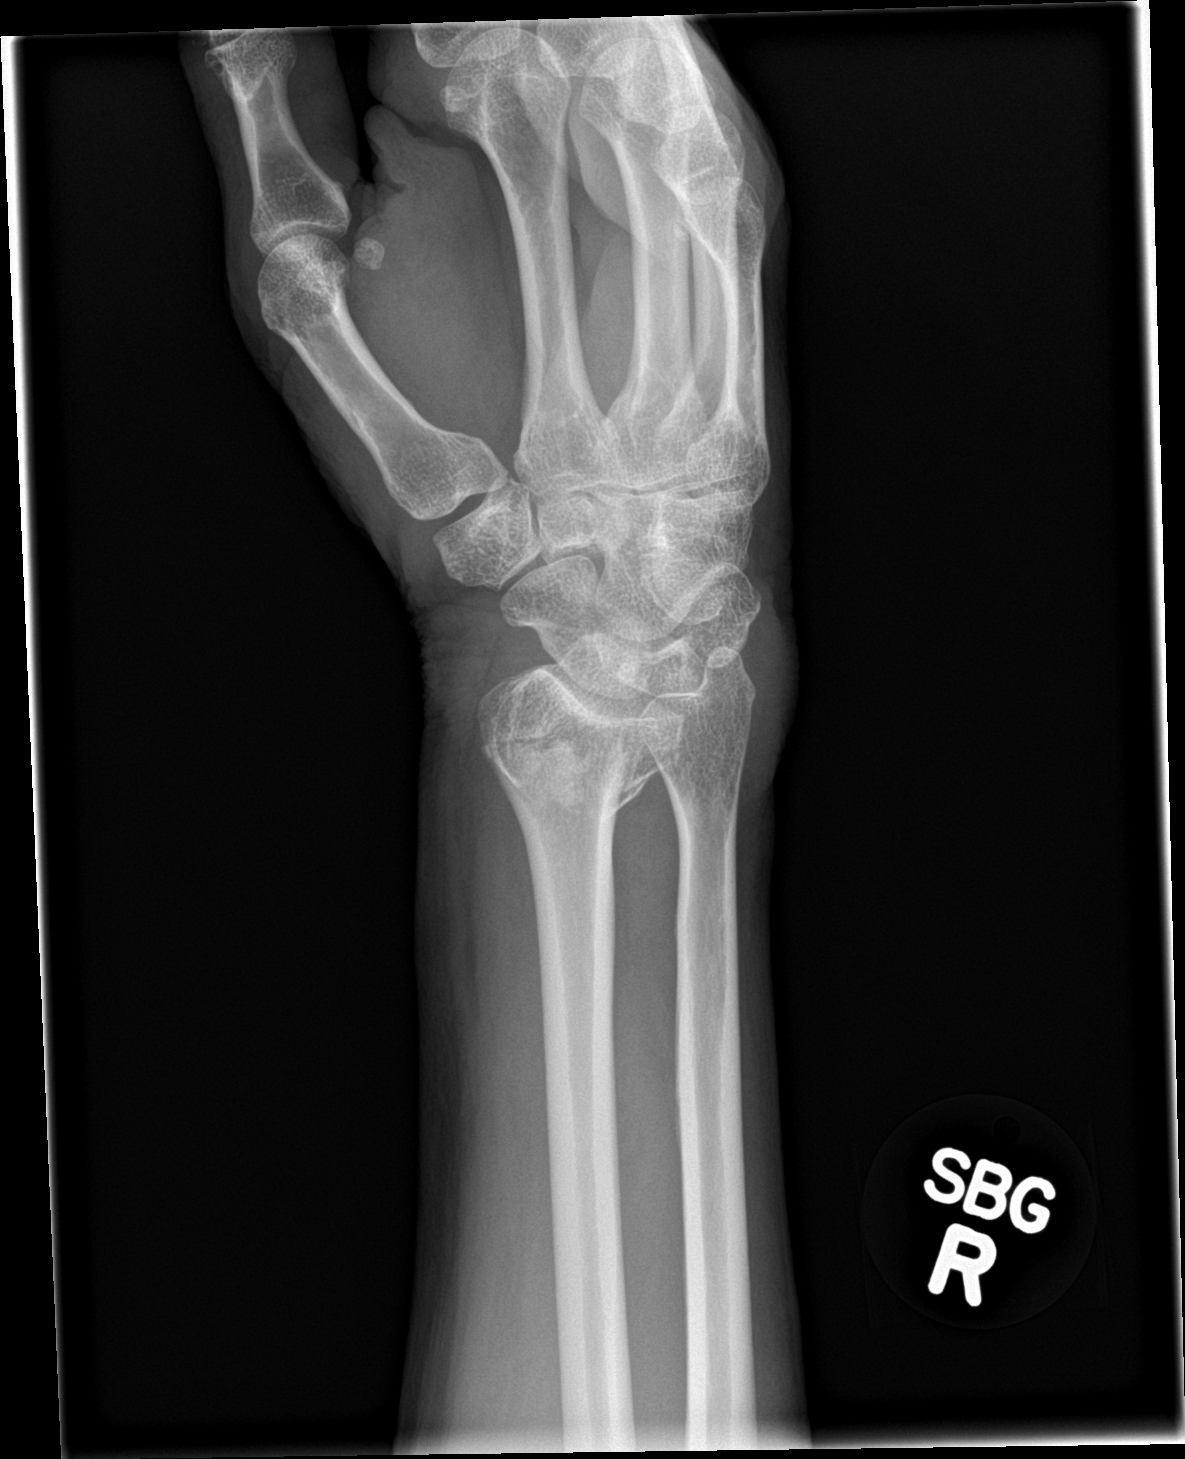

[wrist lat]
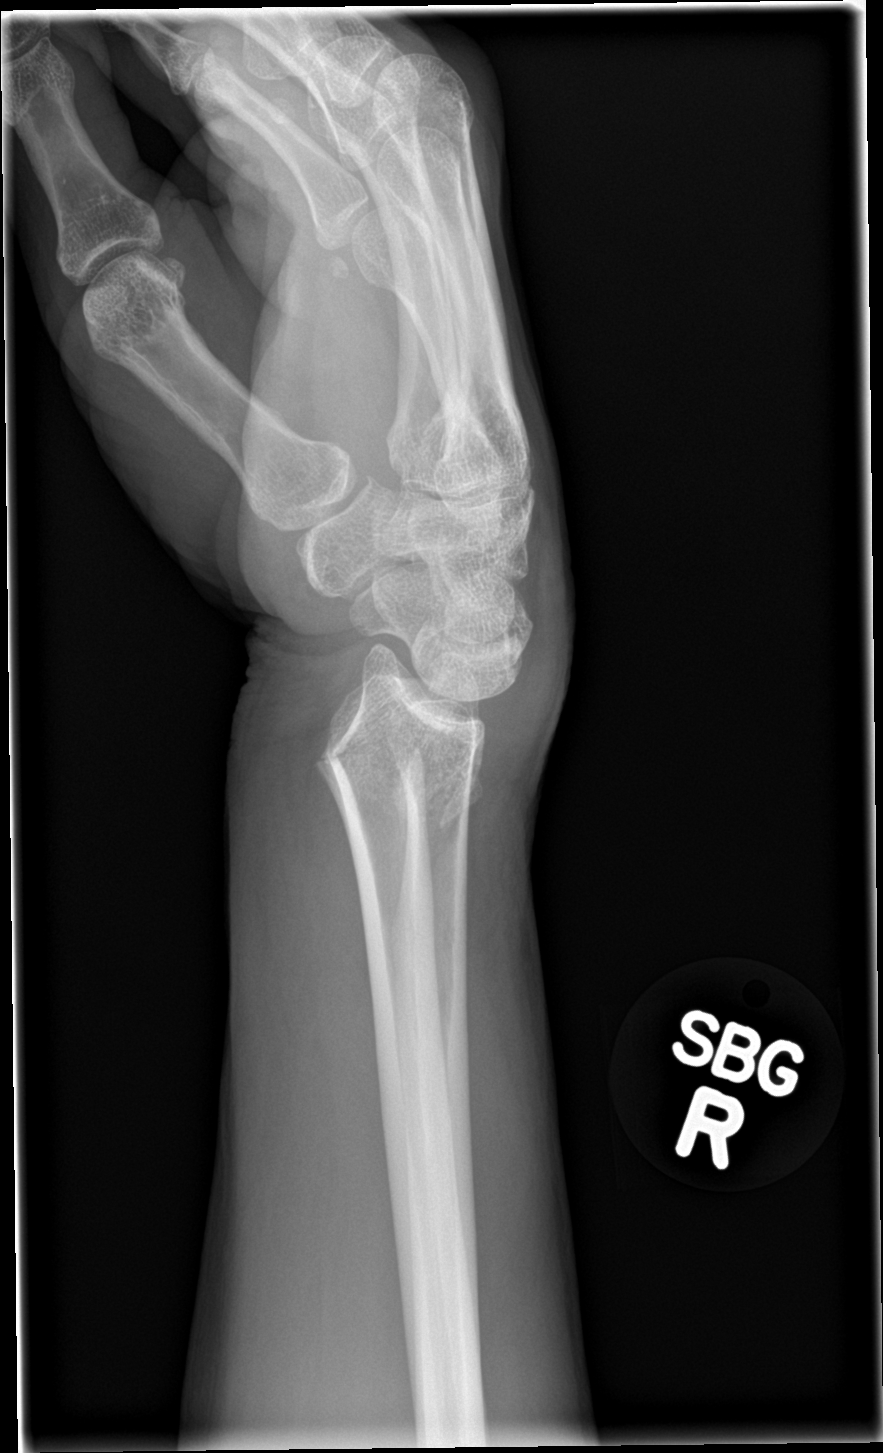

[wrist navicular]
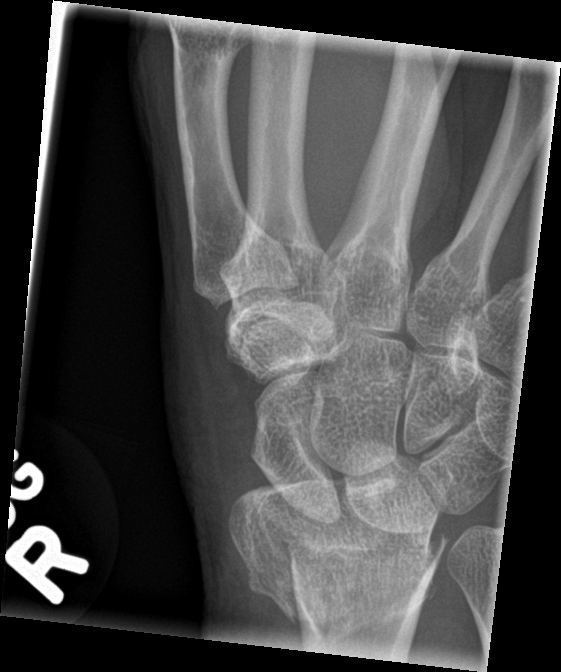

[4 of 4 positions shown; findings below may reference images not displayed]

FINDINGS: Right wrist:

There is a comminuted dorsally impacted intra-articular fracture of
the distal radius. No definite ulnar styloid fracture. The carpal
bones are intact.

Lumbar spine:

Mild right convex lumbar scoliosis and moderate degenerative lumbar
spondylosis with advanced disc disease and facet disease at L4-5. No
acute fracture is identified.
IMPRESSION: Dorsally impacted distal radius fracture (collies fracture).

Advanced degenerative disc disease at L4-5 but no acute findings in
the lumbar spine.

## 2016-08-20 ENCOUNTER — Ambulatory Visit (INDEPENDENT_AMBULATORY_CARE_PROVIDER_SITE_OTHER): Payer: 59 | Admitting: Licensed Clinical Social Worker

## 2016-08-20 DIAGNOSIS — F4323 Adjustment disorder with mixed anxiety and depressed mood: Secondary | ICD-10-CM | POA: Diagnosis not present

## 2016-08-27 ENCOUNTER — Ambulatory Visit: Payer: Managed Care, Other (non HMO) | Admitting: Licensed Clinical Social Worker

## 2016-09-17 ENCOUNTER — Ambulatory Visit (INDEPENDENT_AMBULATORY_CARE_PROVIDER_SITE_OTHER): Payer: 59 | Admitting: Licensed Clinical Social Worker

## 2016-09-17 DIAGNOSIS — F4323 Adjustment disorder with mixed anxiety and depressed mood: Secondary | ICD-10-CM | POA: Diagnosis not present

## 2016-10-15 ENCOUNTER — Ambulatory Visit (INDEPENDENT_AMBULATORY_CARE_PROVIDER_SITE_OTHER): Payer: 59 | Admitting: Licensed Clinical Social Worker

## 2016-10-15 DIAGNOSIS — F4323 Adjustment disorder with mixed anxiety and depressed mood: Secondary | ICD-10-CM | POA: Diagnosis not present
